# Patient Record
Sex: Female | Born: 1995 | Race: White | Hispanic: No | Marital: Single | Smoking: Never smoker
Health system: Southern US, Community
[De-identification: ages and names within clinical notes are randomized; demographics above are authoritative.]

## PROBLEM LIST (undated history)

## (undated) DIAGNOSIS — D649 Anemia, unspecified: Secondary | ICD-10-CM

## (undated) HISTORY — DX: Anemia, unspecified: D64.9

## (undated) HISTORY — PX: NO PAST SURGERIES: SHX2092

---

## 2017-10-27 ENCOUNTER — Telehealth: Payer: Self-pay | Admitting: *Deleted

## 2017-10-27 DIAGNOSIS — O3680X0 Pregnancy with inconclusive fetal viability, not applicable or unspecified: Secondary | ICD-10-CM

## 2017-10-27 NOTE — Telephone Encounter (Signed)
Pt left message on 8/16 stating that she is calling to speak with Jeanetta to get an ultrasound scheduled.  [facility] was [facility]. Per discussion w/Jeanetta Louellen MolderBellamy, RN - she received call from the [facility] on 8/14 regarding need for US and has been trying to contact pt to obtain demographics in order to schedule the requested study. I spoke w/pt and obtained the necessary information. US scheduled on 8/22 @ 1545. Pt advised to have a full bladder and to bring insurance card to the appt.  She will receive results in our office following the ultrasound. Pt voiced understanding of all information and instructions given.

## 2017-10-30 ENCOUNTER — Ambulatory Visit (HOSPITAL_COMMUNITY)
Admission: RE | Admit: 2017-10-30 | Discharge: 2017-10-30 | Disposition: A | Discharge status: Home / Self Care | Payer: BC Managed Care – PPO | Source: Ambulatory Visit | Attending: Family Medicine | Admitting: Family Medicine

## 2017-10-30 DIAGNOSIS — O3680X0 Pregnancy with inconclusive fetal viability, not applicable or unspecified: Secondary | ICD-10-CM | POA: Diagnosis present

## 2017-10-30 DIAGNOSIS — Z3A09 9 weeks gestation of pregnancy: Secondary | ICD-10-CM | POA: Insufficient documentation

## 2017-12-01 ENCOUNTER — Other Ambulatory Visit (INDEPENDENT_AMBULATORY_CARE_PROVIDER_SITE_OTHER): Payer: BC Managed Care – PPO

## 2017-12-01 ENCOUNTER — Encounter: Payer: Self-pay | Admitting: Certified Nurse Midwife

## 2017-12-01 ENCOUNTER — Ambulatory Visit (INDEPENDENT_AMBULATORY_CARE_PROVIDER_SITE_OTHER): Payer: BC Managed Care – PPO | Admitting: Certified Nurse Midwife

## 2017-12-01 VITALS — BP 133/88 | HR 118 | Ht 66.0 in | Wt 204.7 lb

## 2017-12-01 DIAGNOSIS — Z3402 Encounter for supervision of normal first pregnancy, second trimester: Secondary | ICD-10-CM

## 2017-12-01 DIAGNOSIS — N926 Irregular menstruation, unspecified: Secondary | ICD-10-CM

## 2017-12-01 DIAGNOSIS — Z3A14 14 weeks gestation of pregnancy: Secondary | ICD-10-CM | POA: Diagnosis not present

## 2017-12-01 DIAGNOSIS — N8311 Corpus luteum cyst of right ovary: Secondary | ICD-10-CM

## 2017-12-01 DIAGNOSIS — Z6833 Body mass index (BMI) 33.0-33.9, adult: Secondary | ICD-10-CM

## 2017-12-01 DIAGNOSIS — O3412 Maternal care for benign tumor of corpus uteri, second trimester: Secondary | ICD-10-CM

## 2017-12-01 DIAGNOSIS — O0932 Supervision of pregnancy with insufficient antenatal care, second trimester: Secondary | ICD-10-CM | POA: Diagnosis not present

## 2017-12-01 NOTE — Progress Notes (Signed)
NEW OB HISTORY AND PHYSICAL  SUBJECTIVE:       Crystal Shepard is a 22 y.o. G1P0 female, Patient's last menstrual period was 08/23/2017., Estimated Date of Delivery: 05/30/18, [redacted]w[redacted]d, presents today for establishment of Prenatal Care.  She has no unusual complaints. Endorses nausea without vomiting, breast tenderness, and intermittent mild abdominal cramping.   Denies difficulty breathing or respiratory distress, chest pain, abdominal pain, dysuria, vaginal bleeding, and leg pain or swelling.   Requests postpartum pap smear.    Gynecologic History  Patient's last menstrual period was 08/23/2017.   Contraception: none  Last Pap: 2014. Results were: normal  Obstetric History OB History  Gravida Para Term Preterm AB Living  1            SAB TAB Ectopic Multiple Live Births               # Outcome Date GA Lbr Len/2nd Weight Sex Delivery Anes PTL Lv  1 Current             History reviewed. No pertinent past medical history.  History reviewed. No pertinent surgical history.  Current Outpatient Medications on File Prior to Visit  Medication Sig Dispense Refill  . Prenatal Vit-Fe Fumarate-FA (PRENATAL MULTIVITAMIN) TABS tablet Take 1 tablet by mouth daily at 12 noon.     No current facility-administered medications on file prior to visit.     No Known Allergies  Social History   Socioeconomic History  . Marital status: Single    Spouse name: Not on file  . Number of children: Not on file  . Years of education: Not on file  . Highest education level: Not on file  Occupational History  . Not on file  Social Needs  . Financial resource strain: Not on file  . Food insecurity:    Worry: Not on file    Inability: Not on file  . Transportation needs:    Medical: Not on file    Non-medical: Not on file  Tobacco Use  . Smoking status: Never Smoker  . Smokeless tobacco: Never Used  Substance and Sexual Activity  . Alcohol use: Not Currently  . Drug use: Never  . Sexual  activity: Yes  Lifestyle  . Physical activity:    Days per week: Not on file    Minutes per session: Not on file  . Stress: Not on file  Relationships  . Social connections:    Talks on phone: Not on file    Gets together: Not on file    Attends religious service: Not on file    Active member of club or organization: Not on file    Attends meetings of clubs or organizations: Not on file    Relationship status: Not on file  . Intimate partner violence:    Fear of current or ex partner: Not on file    Emotionally abused: Not on file    Physically abused: Not on file    Forced sexual activity: Not on file  Other Topics Concern  . Not on file  Social History Narrative  . Not on file    History reviewed. No pertinent family history.  The following portions of the patient's history were reviewed and updated as appropriate: allergies, current medications, past OB history, past medical history, past surgical history, past family history, past social history, and problem list.    OBJECTIVE:  BP 133/88   Pulse (!) 118   Ht 5\' 6"  (1.676 m)   Wt  204 lb 11.2 oz (92.9 kg)   LMP 08/23/2017   BMI 33.04 kg/m   Initial Physical Exam (New OB)  GENERAL APPEARANCE: alert, well appearing, in no apparent distress  HEAD: normocephalic, atraumatic  MOUTH: mucous membranes moist, pharynx normal without lesions  THYROID: no thyromegaly or masses present  BREASTS: patient declined exam  LUNGS: clear to auscultation, no wheezes, rales or rhonchi, symmetric air entry  HEART: regular rate and rhythm, no murmurs  ABDOMEN: soft, nontender, nondistended, no abnormal masses, no epigastric pain and FHT present; obese  EXTREMITIES: no redness or tenderness in the calves or thighs, no edema  SKIN: normal coloration and turgor, no rashes  LYMPH NODES: no adenopathy palpable  NEUROLOGIC: alert, oriented, normal speech, no focal findings or movement disorder noted  PELVIC EXAM: not  indicated  ULTRASOUND REPORT  Location: [facility] Date of Service:  12/01/2017  Indications: Dating/Viability Findings:  Mason JimSingleton intrauterine pregnancy is visualized with a CRL consistent with [redacted] weeks gestation, giving an (U/S) EDD of 06/01/18. The (U/S) EDD is consistent with the clinically established (LMP) EDD of 05/30/18.  FHR: 145 BPM CRL measurement: 80.5 mm Early anatomy is normal. Placenta appears anterior and is grade 0.  Right Ovary measures 3.7 x 2.8 x 2.3 cm. It is normal in appearance. Left Ovary was not visualized. There is evidence of a corpus luteal cyst in the right ovary. Survey of the adnexa demonstrates no adnexal masses. There is no free peritoneal fluid in the cul de sac.  Impression: 1. 14 week Viable Singleton Intrauterine pregnancy by U/S. 2. (U/S) EDD is consistent with Clinically established (LMP) EDD of 05/30/18.  Recommendations: 1.Clinical correlation with the patient's History and Physical Exam.  ASSESSMENT: Normal pregnancy Late entry prenatal care  PLAN: Prenatal care New OB counseling: The patient has been given an overview regarding routine prenatal care. Recommendations regarding diet, weight gain, and exercise in pregnancy were given. Prenatal testing, optional genetic testing, and ultrasound use in pregnancy were reviewed.  Benefits of Breast Feeding were discussed. The patient is encouraged to consider nursing her baby post partum. See orders   Gunnar BullaJenkins Michelle Deniah Saia, CNM [facility], [facility]

## 2017-12-01 NOTE — Patient Instructions (Addendum)
Round Ligament Pain The round ligament is a cord of muscle and tissue that helps to support the uterus. It can become a source of pain during pregnancy if it becomes stretched or twisted as the baby grows. The pain usually begins in the second trimester of pregnancy, and it can come and go until the baby is delivered. It is not a serious problem, and it does not cause harm to the baby. Round ligament pain is usually a short, sharp, and pinching pain, but it can also be a dull, lingering, and aching pain. The pain is felt in the lower side of the abdomen or in the groin. It usually starts deep in the groin and moves up to the outside of the hip area. Pain can occur with:  A sudden change in position.  Rolling over in bed.  Coughing or sneezing.  Physical activity.  Follow these instructions at home: Watch your condition for any changes. Take these steps to help with your pain:  When the pain starts, relax. Then try: ? Sitting down. ? Flexing your knees up to your abdomen. ? Lying on your side with one pillow under your abdomen and another pillow between your legs. ? Sitting in a warm bath for 15-20 minutes or until the pain goes away.  Take over-the-counter and prescription medicines only as told by your health care provider.  Move slowly when you sit and stand.  Avoid long walks if they cause pain.  Stop or lessen your physical activities if they cause pain.  Contact a health care provider if:  Your pain does not go away with treatment.  You feel pain in your back that you did not have before.  Your medicine is not helping. Get help right away if:  You develop a fever or chills.  You develop uterine contractions.  You develop vaginal bleeding.  You develop nausea or vomiting.  You develop diarrhea.  You have pain when you urinate. This information is not intended to replace advice given to you by your health care provider. Make sure you discuss any questions you have  with your health care provider. Document Released: 12/05/2007 Document Revised: 08/03/2015 Document Reviewed: 05/04/2014 Elsevier Interactive Patient Education  2018 [employer]. Back Pain in Pregnancy Back pain during pregnancy is common. Back pain may be caused by several factors that are related to changes during your pregnancy. Follow these instructions at home: Managing pain, stiffness, and swelling  If directed, apply ice for sudden (acute) back pain. ? Put ice in a plastic bag. ? Place a towel between your skin and the bag. ? Leave the ice on for 20 minutes, 2-3 times per day.  If directed, apply heat to the affected area before you exercise: ? Place a towel between your skin and the heat pack or heating pad. ? Leave the heat on for 20-30 minutes. ? Remove the heat if your skin turns bright red. This is especially important if you are unable to feel pain, heat, or cold. You may have a greater risk of getting burned. Activity  Exercise as told by your health care provider. Exercising is the best way to prevent or manage back pain.  Listen to your body when lifting. If lifting hurts, ask for help or bend your knees. This uses your leg muscles instead of your back muscles.  Squat down when picking up something from the floor. Do not bend over.  Only use bed rest as told by your health care provider. Bed  rest should only be used for the most severe episodes of back pain. Standing, Sitting, and Lying Down  Do not stand in one place for long periods of time.  Use good posture when sitting. Make sure your head rests over your shoulders and is not hanging forward. Use a pillow on your lower back if necessary.  Try sleeping on your side, preferably the left side, with a pillow or two between your legs. If you are sore after a night's rest, your bed may be too soft. A firm mattress may provide more support for your back during pregnancy. General instructions  Do not wear high  heels.  Eat a healthy diet. Try to gain weight within your health care provider's recommendations.  Use a maternity girdle, elastic sling, or back brace as told by your health care provider.  Take over-the-counter and prescription medicines only as told by your health care provider.  Keep all follow-up visits as told by your health care provider. This is important. This includes any visits with any specialists, such as a physical therapist. Contact a health care provider if:  Your back pain interferes with your daily activities.  You have increasing pain in other parts of your body. Get help right away if:  You develop numbness, tingling, weakness, or problems with the use of your arms or legs.  You develop severe back pain that is not controlled with medicine.  You have a sudden change in bowel or bladder control.  You develop shortness of breath, dizziness, or you faint.  You develop nausea, vomiting, or sweating.  You have back pain that is a rhythmic, cramping pain similar to labor pains. Labor pain is usually 1-2 minutes apart, lasts for about 1 minute, and involves a bearing down feeling or pressure in your pelvis.  You have back pain and your water breaks or you have vaginal bleeding.  You have back pain or numbness that travels down your leg.  Your back pain developed after you fell.  You develop pain on one side of your back.  You see blood in your urine.  You develop skin blisters in the area of your back pain. This information is not intended to replace advice given to you by your health care provider. Make sure you discuss any questions you have with your health care provider. Document Released: 06/05/2005 Document Revised: 08/03/2015 Document Reviewed: 11/09/2014 Elsevier Interactive Patient Education  2018 [place] for Pregnant Women While you are pregnant, your body will require additional nutrition to help support your growing baby. It is  recommended that you consume:  150 additional calories each day during your first trimester.  300 additional calories each day during your second trimester.  300 additional calories each day during your third trimester.  Eating a healthy, well-balanced diet is very important for your health and for your baby's health. You also have a higher need for some vitamins and minerals, such as folic acid, calcium, iron, and vitamin D. What do I need to know about eating during pregnancy?  Do not try to lose weight or go on a diet during pregnancy.  Choose healthy, nutritious foods. Choose  of a sandwich with a glass of milk instead of a candy bar or a high-calorie sugar-sweetened beverage.  Limit your overall intake of foods that have "empty calories." These are foods that have little nutritional value, such as sweets, desserts, candies, sugar-sweetened beverages, and fried foods.  Eat a variety of foods, especially fruits and  vegetables.  Take a prenatal vitamin to help meet the additional needs during pregnancy, specifically for folic acid, iron, calcium, and vitamin D.  Remember to stay active. Ask your health care provider for exercise recommendations that are specific to you.  Practice good food safety and cleanliness, such as washing your hands before you eat and after you prepare raw meat. This helps to prevent foodborne illnesses, such as listeriosis, that can be very dangerous for your baby. Ask your health care provider for more information about listeriosis. What does 150 extra calories look like? Healthy options for an additional 150 calories each day could be any of the following:  Plain low-fat yogurt (6-8 oz) with  cup of berries.  1 apple with 2 teaspoons of peanut butter.  Cut-up vegetables with  cup of hummus.  Low-fat chocolate milk (8 oz or 1 cup).  1 string cheese with 1 medium orange.   of a peanut butter and jelly sandwich on whole-wheat bread (1 tsp of peanut  butter).  For 300 calories, you could eat two of those healthy options each day. What is a healthy amount of weight to gain? The recommended amount of weight for you to gain is based on your pre-pregnancy BMI. If your pre-pregnancy BMI was:  Less than 18 (underweight), you should gain 28-40 lb.  18-24.9 (normal), you should gain 25-35 lb.  25-29.9 (overweight), you should gain 15-25 lb.  Greater than 30 (obese), you should gain 11-20 lb.  What if I am having twins or multiples? Generally, pregnant women who will be having twins or multiples may need to increase their daily calories by 300-600 calories each day. The recommended range for total weight gain is 25-54 lb, depending on your pre-pregnancy BMI. Talk with your health care provider for specific guidance about additional nutritional needs, weight gain, and exercise during your pregnancy. What foods can I eat? Grains Any grains. Try to choose whole grains, such as whole-wheat bread, oatmeal, or brown rice. Vegetables Any vegetables. Try to eat a variety of colors and types of vegetables to get a full range of vitamins and minerals. Remember to wash your vegetables well before eating. Fruits Any fruits. Try to eat a variety of colors and types of fruit to get a full range of vitamins and minerals. Remember to wash your fruits well before eating. Meats and Other Protein Sources Lean meats, including chicken, [place], fish, and lean cuts of beef, veal, or pork. Make sure that all meats are cooked to "well done." Tofu. Tempeh. Beans. Eggs. Peanut butter and other nut butters. Seafood, such as shrimp, crab, and lobster. If you choose fish, select types that are higher in omega-3 fatty acids, including salmon, herring, mussels, trout, sardines, and pollock. Make sure that all meats are cooked to food-safe temperatures. Dairy Pasteurized milk and milk alternatives. Pasteurized yogurt and pasteurized cheese. Cottage cheese. Sour  cream. Beverages Water. Juices that contain 100% fruit juice or vegetable juice. Caffeine-free teas and decaffeinated coffee. Drinks that contain caffeine are okay to drink, but it is better to avoid caffeine. Keep your total caffeine intake to less than 200 mg each day (12 oz of coffee, tea, or soda) or as directed by your health care provider. Condiments Any pasteurized condiments. Sweets and Desserts Any sweets and desserts. Fats and Oils Any fats and oils. The items listed above may not be a complete list of recommended foods or beverages. Contact your dietitian for more options. What foods are not recommended? Vegetables Unpasteurized (raw)  vegetable juices. Fruits Unpasteurized (raw) fruit juices. Meats and Other Protein Sources Cured meats that have nitrates, such as bacon, salami, and hotdogs. Luncheon meats, bologna, or other deli meats (unless they are reheated until they are steaming hot). Refrigerated pate, meat spreads from a meat counter, smoked seafood that is found in the refrigerated section of a store. Raw fish, such as sushi or sashimi. High mercury content fish, such as tilefish, shark, swordfish, and king mackerel. Raw meats, such as tuna or beef tartare. Undercooked meats and poultry. Make sure that all meats are cooked to food-safe temperatures. Dairy Unpasteurized (raw) milk and any foods that have raw milk in them. Soft cheeses, such as feta, queso blanco, queso fresco, Brie, Camembert cheeses, blue-veined cheeses, and Panela cheese (unless it is made with pasteurized milk, which must be stated on the label). Beverages Alcohol. Sugar-sweetened beverages, such as sodas, teas, or energy drinks. Condiments Homemade fermented foods and drinks, such as pickles, sauerkraut, or kombucha drinks. (Store-bought pasteurized versions of these are okay.) Other Salads that are made in the store, such as ham salad, chicken salad, egg salad, tuna salad, and seafood salad. The items  listed above may not be a complete list of foods and beverages to avoid. Contact your dietitian for more information. This information is not intended to replace advice given to you by your health care provider. Make sure you discuss any questions you have with your health care provider. Document Released: 12/10/2013 Document Revised: 08/03/2015 Document Reviewed: 08/10/2013 Elsevier Interactive Patient Education  2018 [employer]. Common Medications Safe in Pregnancy  Acne:      Constipation:  Benzoyl Peroxide     Colace  Clindamycin      Dulcolax Suppository  Topica Erythromycin     Fibercon  Salicylic Acid      Metamucil         Miralax AVOID:        Senakot   Accutane    Cough:  Retin-A       Cough Drops  Tetracycline      Phenergan w/ Codeine if Rx  Minocycline      Robitussin (Plain & DM)  Antibiotics:     Crabs/Lice:  Ceclor       RID  Cephalosporins    AVOID:  E-Mycins      Kwell  Keflex  Macrobid/Macrodantin   Diarrhea:  Penicillin      Kao-Pectate  Zithromax      Imodium AD         PUSH FLUIDS AVOID:       Cipro     Fever:  Tetracycline      Tylenol (Regular or Extra  Minocycline       Strength)  Levaquin      Extra Strength-Do not          Exceed 8 tabs/24 hrs Caffeine:        <245m/day (equiv. To 1 cup of coffee or  approx. 3 12 oz sodas)         Gas: Cold/Hayfever:       Gas-X  Benadryl      Mylicon  Claritin       Phazyme  **Claritin-D        Chlor-Trimeton    Headaches:  Dimetapp      ASA-Free Excedrin  Drixoral-Non-Drowsy     Cold Compress  Mucinex (Guaifenasin)     Tylenol (Regular or Extra  Sudafed/Sudafed-12 Hour     Strength)  **Sudafed PE Pseudoephedrine  Tylenol Cold & Sinus     Vicks Vapor Rub  Zyrtec  **AVOID if Problems With Blood Pressure         Heartburn: Avoid lying down for at least 1 hour after meals  Aciphex      Maalox     Rash:  Milk of Magnesia     Benadryl    Mylanta       1% Hydrocortisone Cream  Pepcid  Pepcid  Complete   Sleep Aids:  Prevacid      Ambien   Prilosec       Benadryl  Rolaids       Chamomile Tea  Tums (Limit 4/day)     Unisom  Zantac       Tylenol PM         Warm milk-add vanilla or  Hemorrhoids:       Sugar for taste  Anusol/Anusol H.C.  (RX: Analapram 2.5%)  Sugar Substitutes:  Hydrocortisone OTC     Ok in moderation  Preparation H      Tucks        Vaseline lotion applied to tissue with wiping    Herpes:     Throat:  Acyclovir      Oragel  Famvir  Valtrex     Vaccines:         Flu Shot Leg Cramps:       *Gardasil  Benadryl      Hepatitis A         Hepatitis B Nasal Spray:       Pneumovax  Saline Nasal Spray     Polio Booster         Tetanus Nausea:       Tuberculosis test or PPD  Vitamin B6 25 mg TID   AVOID:    Dramamine      *Gardasil  Emetrol       Live Poliovirus  Ginger Root 250 mg QID    MMR (measles, mumps &  High Complex Carbs @ Bedtime    rebella)  Sea Bands-Accupressure    Varicella (Chickenpox)  Unisom 1/2 tab TID     *No known complications           If received before Pain:         Known pregnancy;   Darvocet       Resume series after  Lortab        Delivery  Percocet    Yeast:   Tramadol      Femstat  Tylenol 3      Gyne-lotrimin  Ultram       Monistat  Vicodin           MISC:         All Sunscreens           Hair Coloring/highlights          Insect Repellant's          (Including DEET)         Mystic Tans Second Trimester of Pregnancy The second trimester is from week 13 through week 28, month 4 through 6. This is often the time in pregnancy that you feel your best. Often times, morning sickness has lessened or quit. You may have more energy, and you may get hungry more often. Your unborn baby (fetus) is growing rapidly. At the end of the sixth month, he or she is about 9 inches long and weighs about 1 pounds. You will likely feel the  baby move (quickening) between 18 and 20 weeks of pregnancy. Follow these instructions at home:  Avoid  all smoking, herbs, and alcohol. Avoid drugs not approved by your doctor.  Do not use any tobacco products, including cigarettes, chewing tobacco, and electronic cigarettes. If you need help quitting, ask your doctor. You may get counseling or other support to help you quit.  Only take medicine as told by your doctor. Some medicines are safe and some are not during pregnancy.  Exercise only as told by your doctor. Stop exercising if you start having cramps.  Eat regular, healthy meals.  Wear a good support bra if your breasts are tender.  Do not use hot tubs, steam rooms, or saunas.  Wear your seat belt when driving.  Avoid raw meat, uncooked cheese, and liter boxes and soil used by cats.  Take your prenatal vitamins.  Take 1500-2000 milligrams of calcium daily starting at the 20th week of pregnancy until you deliver your baby.  Try taking medicine that helps you poop (stool softener) as needed, and if your doctor approves. Eat more fiber by eating fresh fruit, vegetables, and whole grains. Drink enough fluids to keep your pee (urine) clear or pale yellow.  Take warm water baths (sitz baths) to soothe pain or discomfort caused by hemorrhoids. Use hemorrhoid cream if your doctor approves.  If you have puffy, bulging veins (varicose veins), wear support hose. Raise (elevate) your feet for 15 minutes, 3-4 times a day. Limit salt in your diet.  Avoid heavy lifting, wear low heals, and sit up straight.  Rest with your legs raised if you have leg cramps or low back pain.  Visit your dentist if you have not gone during your pregnancy. Use a soft toothbrush to brush your teeth. Be gentle when you floss.  You can have sex (intercourse) unless your doctor tells you not to.  Go to your doctor visits. Get help if:  You feel dizzy.  You have mild cramps or pressure in your lower belly (abdomen).  You have a nagging pain in your belly area.  You continue to feel sick to your stomach  (nauseous), throw up (vomit), or have watery poop (diarrhea).  You have bad smelling fluid coming from your vagina.  You have pain with peeing (urination). Get help right away if:  You have a fever.  You are leaking fluid from your vagina.  You have spotting or bleeding from your vagina.  You have severe belly cramping or pain.  You lose or gain weight rapidly.  You have trouble catching your breath and have chest pain.  You notice sudden or extreme puffiness (swelling) of your face, hands, ankles, feet, or legs.  You have not felt the baby move in over an hour.  You have severe headaches that do not go away with medicine.  You have vision changes. This information is not intended to replace advice given to you by your health care provider. Make sure you discuss any questions you have with your health care provider. Document Released: 05/22/2009 Document Revised: 08/03/2015 Document Reviewed: 04/28/2012 Elsevier Interactive Patient Education  2017 [employer] WHAT OB PATIENTS CAN EXPECT   Confirmation of pregnancy and ultrasound ordered if medically indicated-[redacted] weeks gestation  New OB (NOB) intake with nurse and New OB (NOB) labs- [redacted] weeks gestation  New OB (NOB) physical examination with provider- 11/[redacted] weeks gestation  Flu vaccine-[redacted] weeks gestation  Anatomy scan-[redacted] weeks gestation  Glucose tolerance test, blood work to test for  anemia, T-dap vaccine-[redacted] weeks gestation  Vaginal swabs/cultures-STD/Group B strep-[redacted] weeks gestation  Appointments every 4 weeks until 28 weeks  Every 2 weeks from 28 weeks until 36 weeks  Weekly visits from 36 weeks until delivery

## 2017-12-01 NOTE — Progress Notes (Signed)
Crystal Shepard presents for NOB nurse interview visit. Pregnancy confirmation done _here at [facility].  G- 1.  P-    . Pregnancy education material explained and given. There is  cats in the home. NOB labs ordered. (TSH/HbgA1c due to Increased BMI),. HIV labs and Drug screen were explained optional and she did not decline. Drug screen ordered PNV encouraged. Genetic screening options discussed. Genetic testing: Ordered/.  Pt may discuss with provider.   All questions answered. Pt signed FMLA form  Pt came in for confirmation, LMP 08/23/17, EDD 05/30/18, will go ahead and give pt NOB packet

## 2017-12-02 LAB — ABO AND RH: RH TYPE: POSITIVE

## 2017-12-02 LAB — URINALYSIS, ROUTINE W REFLEX MICROSCOPIC
Bilirubin, UA: NEGATIVE
Glucose, UA: NEGATIVE
Ketones, UA: NEGATIVE
LEUKOCYTES UA: NEGATIVE
NITRITE UA: NEGATIVE
Protein, UA: NEGATIVE
RBC, UA: NEGATIVE
Specific Gravity, UA: 1.019 (ref 1.005–1.030)
Urobilinogen, Ur: 0.2 mg/dL (ref 0.2–1.0)
pH, UA: 7.5 (ref 5.0–7.5)

## 2017-12-02 LAB — RPR: RPR Ser Ql: NONREACTIVE

## 2017-12-02 LAB — TSH: TSH: 1.72 u[IU]/mL (ref 0.450–4.500)

## 2017-12-02 LAB — RUBELLA SCREEN: RUBELLA: 5.56 {index} (ref >0.99)

## 2017-12-02 LAB — HEMOGLOBIN A1C
ESTIMATED AVERAGE GLUCOSE: 111 mg/dL
HEMOGLOBIN A1C: 5.5 % (ref 4.8–5.6)

## 2017-12-02 LAB — VARICELLA ZOSTER ANTIBODY, IGG: VARICELLA: 259 {index} (ref >165)

## 2017-12-02 LAB — HEPATITIS B SURFACE ANTIGEN: Hepatitis B Surface Ag: NEGATIVE

## 2017-12-02 LAB — HIV ANTIBODY (ROUTINE TESTING W REFLEX): HIV Screen 4th Generation wRfx: NONREACTIVE

## 2017-12-02 LAB — ANTIBODY SCREEN: ANTIBODY SCREEN: NEGATIVE

## 2017-12-03 LAB — URINE CULTURE

## 2017-12-03 LAB — GC/CHLAMYDIA PROBE AMP
Chlamydia trachomatis, NAA: NEGATIVE
NEISSERIA GONORRHOEAE BY PCR: NEGATIVE

## 2017-12-04 DIAGNOSIS — Z6833 Body mass index (BMI) 33.0-33.9, adult: Secondary | ICD-10-CM | POA: Insufficient documentation

## 2017-12-11 ENCOUNTER — Telehealth: Payer: Self-pay

## 2017-12-11 NOTE — Telephone Encounter (Signed)
Message left for pt- Panorama results are here. Requested she check her mychart messages.

## 2017-12-15 ENCOUNTER — Encounter: Payer: Self-pay | Admitting: Certified Nurse Midwife

## 2017-12-15 ENCOUNTER — Ambulatory Visit (INDEPENDENT_AMBULATORY_CARE_PROVIDER_SITE_OTHER): Payer: BC Managed Care – PPO | Admitting: Certified Nurse Midwife

## 2017-12-15 VITALS — BP 128/82 | HR 92 | Wt 206.2 lb

## 2017-12-15 DIAGNOSIS — Z23 Encounter for immunization: Secondary | ICD-10-CM

## 2017-12-15 DIAGNOSIS — Z3402 Encounter for supervision of normal first pregnancy, second trimester: Secondary | ICD-10-CM

## 2017-12-15 LAB — POCT URINALYSIS DIPSTICK OB
Bilirubin, UA: NEGATIVE
GLUCOSE, UA: NEGATIVE
Ketones, UA: NEGATIVE
LEUKOCYTES UA: NEGATIVE
NITRITE UA: NEGATIVE
PH UA: 6.5 (ref 5.0–8.0)
PROTEIN: NEGATIVE
RBC UA: NEGATIVE
SPEC GRAV UA: 1.01 (ref 1.010–1.025)
Urobilinogen, UA: 0.2 E.U./dL

## 2017-12-15 NOTE — Patient Instructions (Signed)

## 2017-12-15 NOTE — Progress Notes (Signed)
ROB , doing well. Discussed early glucose screen @ 18 wks. Reviewed weight gain goals of 11-20lbs, discussed round ligament pain and use of belly band. She will return in 2 wks for lab, 4 wks for anatomy scan and ROB.   Doreene Burke, CNM

## 2017-12-15 NOTE — Progress Notes (Signed)
ROB- pt is doing well, flu vaccine given 

## 2017-12-29 ENCOUNTER — Other Ambulatory Visit: Payer: BC Managed Care – PPO

## 2017-12-29 DIAGNOSIS — Z3402 Encounter for supervision of normal first pregnancy, second trimester: Secondary | ICD-10-CM

## 2017-12-30 LAB — GLUCOSE TOLERANCE, 1 HOUR: GLUCOSE, 1HR PP: 130 mg/dL (ref 65–199)

## 2018-01-14 ENCOUNTER — Ambulatory Visit (INDEPENDENT_AMBULATORY_CARE_PROVIDER_SITE_OTHER): Payer: BC Managed Care – PPO | Admitting: Obstetrics and Gynecology

## 2018-01-14 ENCOUNTER — Ambulatory Visit (INDEPENDENT_AMBULATORY_CARE_PROVIDER_SITE_OTHER): Payer: BC Managed Care – PPO

## 2018-01-14 VITALS — BP 129/76 | HR 86 | Wt 209.3 lb

## 2018-01-14 DIAGNOSIS — O09899 Supervision of other high risk pregnancies, unspecified trimester: Secondary | ICD-10-CM

## 2018-01-14 DIAGNOSIS — Z3402 Encounter for supervision of normal first pregnancy, second trimester: Secondary | ICD-10-CM | POA: Diagnosis not present

## 2018-01-14 DIAGNOSIS — Z3492 Encounter for supervision of normal pregnancy, unspecified, second trimester: Secondary | ICD-10-CM | POA: Diagnosis not present

## 2018-01-14 LAB — POCT URINALYSIS DIPSTICK OB
BILIRUBIN UA: NEGATIVE
Glucose, UA: NEGATIVE
Ketones, UA: NEGATIVE
Leukocytes, UA: NEGATIVE
Nitrite, UA: NEGATIVE
PH UA: 6 (ref 5.0–8.0)
POC,PROTEIN,UA: NEGATIVE
RBC UA: NEGATIVE
Spec Grav, UA: 1.01 (ref 1.010–1.025)
UROBILINOGEN UA: 0.2 U/dL

## 2018-01-14 NOTE — Progress Notes (Signed)
ROB and anatomy scan-  Reviewed anatomy scan findings:   Indications: Anatomy Findings:  Singleton intrauterine pregnancy is visualized with FHR at 144 BPM. Biometrics give an (U/S) Gestational age of [redacted] weeks and an (U/S) EDD of 05/27/18; this correlates with the clinically established EDD of 05/29/18.  Fetal presentation is breech.  EFW: 387 grams (0lb 14oz). Placenta: Posterior and grade 1. AFI: WNL subjectively.  2 vessel umbilical cord.  Anatomic survey is complete and appears WNL; Gender - Female.   Right Ovary measures 2.9 x 2.6 x 2.0 cm. It is normal in appearance. Left Ovary measures 2.3 x 2.1 x 2.1 cm. It is normal appearance. There is no obvious evidence of a corpus luteal cyst. Survey of the adnexa demonstrates no adnexal masses. There is no free peritoneal fluid in the cul de sac.  Impression: 1. 21 week Viable Singleton Intrauterine pregnancy by U/S. 2. (U/S) EDD is consistent with Clinically established (LMP) EDD of 05/30/18. 3. Normal Anatomy Scan 4. 2 vessel cord

## 2018-02-13 ENCOUNTER — Ambulatory Visit (INDEPENDENT_AMBULATORY_CARE_PROVIDER_SITE_OTHER): Payer: BC Managed Care – PPO | Admitting: Certified Nurse Midwife

## 2018-02-13 VITALS — BP 119/83 | HR 103 | Wt 216.9 lb

## 2018-02-13 DIAGNOSIS — Z113 Encounter for screening for infections with a predominantly sexual mode of transmission: Secondary | ICD-10-CM

## 2018-02-13 DIAGNOSIS — Z3492 Encounter for supervision of normal pregnancy, unspecified, second trimester: Secondary | ICD-10-CM

## 2018-02-13 DIAGNOSIS — Z130 Encounter for screening for diseases of the blood and blood-forming organs and certain disorders involving the immune mechanism: Secondary | ICD-10-CM

## 2018-02-13 DIAGNOSIS — Z131 Encounter for screening for diabetes mellitus: Secondary | ICD-10-CM

## 2018-02-13 DIAGNOSIS — O09899 Supervision of other high risk pregnancies, unspecified trimester: Secondary | ICD-10-CM | POA: Insufficient documentation

## 2018-02-13 DIAGNOSIS — O09892 Supervision of other high risk pregnancies, second trimester: Secondary | ICD-10-CM

## 2018-02-13 LAB — POCT URINALYSIS DIPSTICK OB
BILIRUBIN UA: NEGATIVE
GLUCOSE, UA: NEGATIVE
KETONES UA: NEGATIVE
Nitrite, UA: NEGATIVE
PH UA: 7.5 (ref 5.0–8.0)
POC,PROTEIN,UA: NEGATIVE
RBC UA: NEGATIVE
SPEC GRAV UA: 1.01 (ref 1.010–1.025)
UROBILINOGEN UA: 0.2 U/dL

## 2018-02-13 MED ORDER — FLUOXETINE HCL 10 MG PO CAPS: 10.0000 mg | ORAL_CAPSULE | Freq: Every day | ORAL | 1 refills | Status: DC

## 2018-02-13 NOTE — Progress Notes (Signed)
ROB, c/o feeling "emotional" since pregnancy started, crying spells every other day.

## 2018-02-13 NOTE — Patient Instructions (Signed)
Eating Plan for Pregnant Women While you are pregnant, your body will require additional nutrition to help support your growing baby. It is recommended that you consume:  150 additional calories each day during your first trimester.  300 additional calories each day during your second trimester.  300 additional calories each day during your third trimester.  Eating a healthy, well-balanced diet is very important for your health and for your baby's health. You also have a higher need for some vitamins and minerals, such as folic acid, calcium, iron, and vitamin D. What do I need to know about eating during pregnancy?  Do not try to lose weight or go on a diet during pregnancy.  Choose healthy, nutritious foods. Choose  of a sandwich with a glass of milk instead of a candy bar or a high-calorie sugar-sweetened beverage.  Limit your overall intake of foods that have "empty calories." These are foods that have little nutritional value, such as sweets, desserts, candies, sugar-sweetened beverages, and fried foods.  Eat a variety of foods, especially fruits and vegetables.  Take a prenatal vitamin to help meet the additional needs during pregnancy, specifically for folic acid, iron, calcium, and vitamin D.  Remember to stay active. Ask your health care provider for exercise recommendations that are specific to you.  Practice good food safety and cleanliness, such as washing your hands before you eat and after you prepare raw meat. This helps to prevent foodborne illnesses, such as listeriosis, that can be very dangerous for your baby. Ask your health care provider for more information about listeriosis. What does 150 extra calories look like? Healthy options for an additional 150 calories each day could be any of the following:  Plain low-fat yogurt (6-8 oz) with  cup of berries.  1 apple with 2 teaspoons of peanut butter.  Cut-up vegetables with  cup of hummus.  Low-fat chocolate milk  (8 oz or 1 cup).  1 string cheese with 1 medium orange.   of a peanut butter and jelly sandwich on whole-wheat bread (1 tsp of peanut butter).  For 300 calories, you could eat two of those healthy options each day. What is a healthy amount of weight to gain? The recommended amount of weight for you to gain is based on your pre-pregnancy BMI. If your pre-pregnancy BMI was:  Less than 18 (underweight), you should gain 28-40 lb.  18-24.9 (normal), you should gain 25-35 lb.  25-29.9 (overweight), you should gain 15-25 lb.  Greater than 30 (obese), you should gain 11-20 lb.  What if I am having twins or multiples? Generally, pregnant women who will be having twins or multiples may need to increase their daily calories by 300-600 calories each day. The recommended range for total weight gain is 25-54 lb, depending on your pre-pregnancy BMI. Talk with your health care provider for specific guidance about additional nutritional needs, weight gain, and exercise during your pregnancy. What foods can I eat? Grains Any grains. Try to choose whole grains, such as whole-wheat bread, oatmeal, or brown rice. Vegetables Any vegetables. Try to eat a variety of colors and types of vegetables to get a full range of vitamins and minerals. Remember to wash your vegetables well before eating. Fruits Any fruits. Try to eat a variety of colors and types of fruit to get a full range of vitamins and minerals. Remember to wash your fruits well before eating. Meats and Other Protein Sources Lean meats, including chicken, [place], fish, and lean cuts of beef, veal,  or pork. Make sure that all meats are cooked to "well done." Tofu. Tempeh. Beans. Eggs. Peanut butter and other nut butters. Seafood, such as shrimp, crab, and lobster. If you choose fish, select types that are higher in omega-3 fatty acids, including salmon, herring, mussels, trout, sardines, and pollock. Make sure that all meats are cooked to food-safe  temperatures. Dairy Pasteurized milk and milk alternatives. Pasteurized yogurt and pasteurized cheese. Cottage cheese. Sour cream. Beverages Water. Juices that contain 100% fruit juice or vegetable juice. Caffeine-free teas and decaffeinated coffee. Drinks that contain caffeine are okay to drink, but it is better to avoid caffeine. Keep your total caffeine intake to less than 200 mg each day (12 oz of coffee, tea, or soda) or as directed by your health care provider. Condiments Any pasteurized condiments. Sweets and Desserts Any sweets and desserts. Fats and Oils Any fats and oils. The items listed above may not be a complete list of recommended foods or beverages. Contact your dietitian for more options. What foods are not recommended? Vegetables Unpasteurized (raw) vegetable juices. Fruits Unpasteurized (raw) fruit juices. Meats and Other Protein Sources Cured meats that have nitrates, such as bacon, salami, and hotdogs. Luncheon meats, bologna, or other deli meats (unless they are reheated until they are steaming hot). Refrigerated pate, meat spreads from a meat counter, smoked seafood that is found in the refrigerated section of a store. Raw fish, such as sushi or sashimi. High mercury content fish, such as tilefish, shark, swordfish, and king mackerel. Raw meats, such as tuna or beef tartare. Undercooked meats and poultry. Make sure that all meats are cooked to food-safe temperatures. Dairy Unpasteurized (raw) milk and any foods that have raw milk in them. Soft cheeses, such as feta, queso blanco, queso fresco, Brie, Camembert cheeses, blue-veined cheeses, and Panela cheese (unless it is made with pasteurized milk, which must be stated on the label). Beverages Alcohol. Sugar-sweetened beverages, such as sodas, teas, or energy drinks. Condiments Homemade fermented foods and drinks, such as pickles, sauerkraut, or kombucha drinks. (Store-bought pasteurized versions of these are  okay.) Other Salads that are made in the store, such as ham salad, chicken salad, egg salad, tuna salad, and seafood salad. The items listed above may not be a complete list of foods and beverages to avoid. Contact your dietitian for more information. This information is not intended to replace advice given to you by your health care provider. Make sure you discuss any questions you have with your health care provider. Document Released: 12/10/2013 Document Revised: 08/03/2015 Document Reviewed: 08/10/2013 Elsevier Interactive Patient Education  2018 [place]. WHAT OB PATIENTS CAN EXPECT   Confirmation of pregnancy and ultrasound ordered if medically indicated-[redacted] weeks gestation  New OB (NOB) intake with nurse and New OB (NOB) labs- [redacted] weeks gestation  New OB (NOB) physical examination with provider- 11/[redacted] weeks gestation  Flu vaccine-[redacted] weeks gestation  Anatomy scan-[redacted] weeks gestation  Glucose tolerance test, blood work to test for anemia, T-dap vaccine-[redacted] weeks gestation  Vaginal swabs/cultures-STD/Group B strep-[redacted] weeks gestation  Appointments every 4 weeks until 28 weeks  Every 2 weeks from 28 weeks until 36 weeks  Weekly visits from 36 weeks until delivery    Breastfeeding Choosing to breastfeed is one of the best decisions you can make for yourself and your baby. A change in hormones during pregnancy causes your breasts to make breast milk in your milk-producing glands. Hormones prevent breast milk from being released before your baby is born. They also prompt  milk flow after birth. Once breastfeeding has begun, thoughts of your baby, as well as his or her sucking or crying, can stimulate the release of milk from your milk-producing glands. Benefits of breastfeeding Research shows that breastfeeding offers many health benefits for infants and mothers. It also offers a cost-free and convenient way to feed your baby. For your baby  Your first milk (colostrum) helps your  baby's digestive system to function better.  Special cells in your milk (antibodies) help your baby to fight off infections.  Breastfed babies are less likely to develop asthma, allergies, obesity, or type 2 diabetes. They are also at lower risk for sudden infant death syndrome (SIDS).  Nutrients in breast milk are better able to meet your baby's needs compared to infant formula.  Breast milk improves your baby's brain development. For you  Breastfeeding helps to create a very special bond between you and your baby.  Breastfeeding is convenient. Breast milk costs nothing and is always available at the correct temperature.  Breastfeeding helps to burn calories. It helps you to lose the weight that you gained during pregnancy.  Breastfeeding makes your uterus return faster to its size before pregnancy. It also slows bleeding (lochia) after you give birth.  Breastfeeding helps to lower your risk of developing type 2 diabetes, osteoporosis, rheumatoid arthritis, cardiovascular disease, and breast, ovarian, uterine, and endometrial cancer later in life. Breastfeeding basics Starting breastfeeding  Find a comfortable place to sit or lie down, with your neck and back well-supported.  Place a pillow or a rolled-up blanket under your baby to bring him or her to the level of your breast (if you are seated). Nursing pillows are specially designed to help support your arms and your baby while you breastfeed.  Make sure that your baby's tummy (abdomen) is facing your abdomen.  Gently massage your breast. With your fingertips, massage from the outer edges of your breast inward toward the nipple. This encourages milk flow. If your milk flows slowly, you may need to continue this action during the feeding.  Support your breast with 4 fingers underneath and your thumb above your nipple (make the letter "C" with your hand). Make sure your fingers are well away from your nipple and your baby's  mouth.  Stroke your baby's lips gently with your finger or nipple.  When your baby's mouth is open wide enough, quickly bring your baby to your breast, placing your entire nipple and as much of the areola as possible into your baby's mouth. The areola is the colored area around your nipple. ? More areola should be visible above your baby's upper lip than below the lower lip. ? Your baby's lips should be opened and extended outward (flanged) to ensure an adequate, comfortable latch. ? Your baby's tongue should be between his or her lower gum and your breast.  Make sure that your baby's mouth is correctly positioned around your nipple (latched). Your baby's lips should create a seal on your breast and be turned out (everted).  It is common for your baby to suck about 2-3 minutes in order to start the flow of breast milk. Latching Teaching your baby how to latch onto your breast properly is very important. An improper latch can cause nipple pain, decreased milk supply, and poor weight gain in your baby. Also, if your baby is not latched onto your nipple properly, he or she may swallow some air during feeding. This can make your baby fussy. Burping your baby when you  switch breasts during the feeding can help to get rid of the air. However, teaching your baby to latch on properly is still the best way to prevent fussiness from swallowing air while breastfeeding. Signs that your baby has successfully latched onto your nipple  Silent tugging or silent sucking, without causing you pain. Infant's lips should be extended outward (flanged).  Swallowing heard between every 3-4 sucks once your milk has started to flow (after your let-down milk reflex occurs).  Muscle movement above and in front of his or her ears while sucking.  Signs that your baby has not successfully latched onto your nipple  Sucking sounds or smacking sounds from your baby while breastfeeding.  Nipple pain.  If you think your  baby has not latched on correctly, slip your finger into the corner of your baby's mouth to break the suction and place it between your baby's gums. Attempt to start breastfeeding again. Signs of successful breastfeeding Signs from your baby  Your baby will gradually decrease the number of sucks or will completely stop sucking.  Your baby will fall asleep.  Your baby's body will relax.  Your baby will retain a small amount of milk in his or her mouth.  Your baby will let go of your breast by himself or herself.  Signs from you  Breasts that have increased in firmness, weight, and size 1-3 hours after feeding.  Breasts that are softer immediately after breastfeeding.  Increased milk volume, as well as a change in milk consistency and color by the fifth day of breastfeeding.  Nipples that are not sore, cracked, or bleeding.  Signs that your baby is getting enough milk  Wetting at least 1-2 diapers during the first 24 hours after birth.  Wetting at least 5-6 diapers every 24 hours for the first week after birth. The urine should be clear or pale yellow by the age of 5 days.  Wetting 6-8 diapers every 24 hours as your baby continues to grow and develop.  At least 3 stools in a 24-hour period by the age of 5 days. The stool should be soft and yellow.  At least 3 stools in a 24-hour period by the age of 7 days. The stool should be seedy and yellow.  No loss of weight greater than 10% of birth weight during the first 3 days of life.  Average weight gain of 4-7 oz (113-198 g) per week after the age of 4 days.  Consistent daily weight gain by the age of 5 days, without weight loss after the age of 2 weeks. After a feeding, your baby may spit up a small amount of milk. This is normal. Breastfeeding frequency and duration Frequent feeding will help you make more milk and can prevent sore nipples and extremely full breasts (breast engorgement). Breastfeed when you feel the need to  reduce the fullness of your breasts or when your baby shows signs of hunger. This is called "breastfeeding on demand." Signs that your baby is hungry include:  Increased alertness, activity, or restlessness.  Movement of the head from side to side.  Opening of the mouth when the corner of the mouth or cheek is stroked (rooting).  Increased sucking sounds, smacking lips, cooing, sighing, or squeaking.  Hand-to-mouth movements and sucking on fingers or hands.  Fussing or crying.  Avoid introducing a pacifier to your baby in the first 4-6 weeks after your baby is born. After this time, you may choose to use a pacifier. Research has  shown that pacifier use during the first year of a baby's life decreases the risk of sudden infant death syndrome (SIDS). Allow your baby to feed on each breast as long as he or she wants. When your baby unlatches or falls asleep while feeding from the first breast, offer the second breast. Because newborns are often sleepy in the first few weeks of life, you may need to awaken your baby to get him or her to feed. Breastfeeding times will vary from baby to baby. However, the following rules can serve as a guide to help you make sure that your baby is properly fed:  Newborns (babies 108 weeks of age or younger) may breastfeed every 1-3 hours.  Newborns should not go without breastfeeding for longer than 3 hours during the day or 5 hours during the night.  You should breastfeed your baby a minimum of 8 times in a 24-hour period.  Breast milk pumping Pumping and storing breast milk allows you to make sure that your baby is exclusively fed your breast milk, even at times when you are unable to breastfeed. This is especially important if you go back to work while you are still breastfeeding, or if you are not able to be present during feedings. Your lactation consultant can help you find a method of pumping that works best for you and give you guidelines about how long it  is safe to store breast milk. Caring for your breasts while you breastfeed Nipples can become dry, cracked, and sore while breastfeeding. The following recommendations can help keep your breasts moisturized and healthy:  Avoid using soap on your nipples.  Wear a supportive bra designed especially for nursing. Avoid wearing underwire-style bras or extremely tight bras (sports bras).  Air-dry your nipples for 3-4 minutes after each feeding.  Use only cotton bra pads to absorb leaked breast milk. Leaking of breast milk between feedings is normal.  Use lanolin on your nipples after breastfeeding. Lanolin helps to maintain your skin's normal moisture barrier. Pure lanolin is not harmful (not toxic) to your baby. You may also hand express a few drops of breast milk and gently massage that milk into your nipples and allow the milk to air-dry.  In the first few weeks after giving birth, some women experience breast engorgement. Engorgement can make your breasts feel heavy, warm, and tender to the touch. Engorgement peaks within 3-5 days after you give birth. The following recommendations can help to ease engorgement:  Completely empty your breasts while breastfeeding or pumping. You may want to start by applying warm, moist heat (in the shower or with warm, water-soaked hand towels) just before feeding or pumping. This increases circulation and helps the milk flow. If your baby does not completely empty your breasts while breastfeeding, pump any extra milk after he or she is finished.  Apply ice packs to your breasts immediately after breastfeeding or pumping, unless this is too uncomfortable for you. To do this: ? Put ice in a plastic bag. ? Place a towel between your skin and the bag. ? Leave the ice on for 20 minutes, 2-3 times a day.  Make sure that your baby is latched on and positioned properly while breastfeeding.  If engorgement persists after 48 hours of following these recommendations,  contact your health care provider or a Science writer. Overall health care recommendations while breastfeeding  Eat 3 healthy meals and 3 snacks every day. Well-nourished mothers who are breastfeeding need an additional 450-500 calories a day.  You can meet this requirement by increasing the amount of a balanced diet that you eat.  Drink enough water to keep your urine pale yellow or clear.  Rest often, relax, and continue to take your prenatal vitamins to prevent fatigue, stress, and low vitamin and mineral levels in your body (nutrient deficiencies).  Do not use any products that contain nicotine or tobacco, such as cigarettes and e-cigarettes. Your baby may be harmed by chemicals from cigarettes that pass into breast milk and exposure to secondhand smoke. If you need help quitting, ask your health care provider.  Avoid alcohol.  Do not use illegal drugs or marijuana.  Talk with your health care provider before taking any medicines. These include over-the-counter and prescription medicines as well as vitamins and herbal supplements. Some medicines that may be harmful to your baby can pass through breast milk.  It is possible to become pregnant while breastfeeding. If birth control is desired, ask your health care provider about options that will be safe while breastfeeding your baby. Where to find more information: [employer]: www.llli.org Contact a health care provider if:  You feel like you want to stop breastfeeding or have become frustrated with breastfeeding.  Your nipples are cracked or bleeding.  Your breasts are red, tender, or warm.  You have: ? Painful breasts or nipples. ? A swollen area on either breast. ? A fever or chills. ? Nausea or vomiting. ? Drainage other than breast milk from your nipples.  Your breasts do not become full before feedings by the fifth day after you give birth.  You feel sad and depressed.  Your baby is: ? Too  sleepy to eat well. ? Having trouble sleeping. ? More than 58 week old and wetting fewer than 6 diapers in a 24-hour period. ? Not gaining weight by 39 days of age.  Your baby has fewer than 3 stools in a 24-hour period.  Your baby's skin or the white parts of his or her eyes become yellow. Get help right away if:  Your baby is overly tired (lethargic) and does not want to wake up and feed.  Your baby develops an unexplained fever. Summary  Breastfeeding offers many health benefits for infant and mothers.  Try to breastfeed your infant when he or she shows early signs of hunger.  Gently tickle or stroke your baby's lips with your finger or nipple to allow the baby to open his or her mouth. Bring the baby to your breast. Make sure that much of the areola is in your baby's mouth. Offer one side and burp the baby before you offer the other side.  Talk with your health care provider or lactation consultant if you have questions or you face problems as you breastfeed. This information is not intended to replace advice given to you by your health care provider. Make sure you discuss any questions you have with your health care provider. Document Released: 02/25/2005 Document Revised: 03/29/2016 Document Reviewed: 03/29/2016 Elsevier Interactive Patient Education  2018 [employer]. Pain Relief During Labor and Delivery Many things can cause pain during labor and delivery, including:  Pressure on bones and ligaments due to the baby moving through the pelvis.  Stretching of tissues due to the baby moving through the birth canal.  Muscle tension due to anxiety or nervousness.  The uterus tightening (contracting) and relaxing to help move the baby.  There are many ways to deal with the pain of labor and delivery. They include:  Taking prenatal classes. Taking these classes helps you know what to expect during your baby's birth. What you learn will increase your confidence and decrease your  anxiety.  Practicing relaxation techniques or doing relaxing activities, such as: ? Focused breathing. ? Meditation. ? Visualization. ? Aroma therapy. ? Listening to your favorite music. ? Hypnosis.  Taking a warm shower or bath (hydrotherapy). This may: ? Provide comfort and relaxation. ? Lessen your perception of pain. ? Decrease the amount of pain medicine needed. ? Decrease the length of labor.  Getting a massage or counterpressure on your back.  Applying warm packs or ice packs.  Changing positions often, moving around, or using a birthing ball.  Getting: ? Pain medicine through an IV or injection into a muscle. ? Pain medicine inserted into your spinal column. ? Injections of sterile water just under the skin on your lower back (intradermal injections). ? Laughing gas (nitrous oxide).  Discuss your pain control options with your health care provider during your prenatal visits. Explore the options offered by your hospital or birth center. What kinds of medicine are available? There are two kinds of medicines that can be used to relieve pain during labor and delivery:  Analgesics. These medicines decrease pain without causing you to lose feeling or the ability to move your muscles.  Anesthetics. These medicines block feeling in the body and can decrease your ability to move freely.  Both of these kinds of medicine can cause minor side effects, such as nausea, trouble concentrating, and sleepiness. They can also decrease the baby's heart rate before birth and affect the baby's breathing rate after birth. For this reason, health care providers are careful about when and how much medicine is given. What are specific medicines and procedures that provide pain relief? Local Anesthetics Local anesthetics are used to numb a small area of the body. They may be used along with another kind of anesthetic or used to numb the nerves of the vagina, cervix, and perineum during the  second stage of labor. General Anesthetics General anesthetics cause you to lose consciousness so you do not feel pain. They are usually only used for an emergency cesarean delivery. General anesthetics are given through an IV tube and a mask. Pudendal Block A pudendal block is a form of local anesthetic. It may be used to relieve the pain associated with pushing or stretching of the perineum at the time of delivery or to further numb the perineum. A pudendal block is done by injecting numbing medicine through the vaginal wall into a nerve in the pelvis. Epidural Analgesia Epidural analgesia is given through a flexible IV catheter that is inserted into the lower back. Numbing medicine is delivered continuously to the area near your spinal column nerves (epidural space). After having this type of analgesia, you may be able to move your legs but you most likely will not be able to walk. Depending on the amount of medicine given, you may lose all feeling in the lower half of your body, or you may retain some level of sensation, including the urge to push. Epidural analgesia can be used to provide pain relief for a vaginal birth. Spinal Block A spinal block is similar to epidural analgesia, but the medicine is injected into the spinal fluid instead of the epidural space. A spinal block is only given once. It starts to relieve pain quickly, but the pain relief lasts only 1-6 hours. Spinal blocks can be used for cesarean deliveries. Combined Spinal-Epidural (CSE)  Block A CSE block combines the effects of a spinal block and epidural analgesia. The spinal block works quickly to block all pain. The epidural analgesia provides continuous pain relief, even after the effects of the spinal block have worn off. This information is not intended to replace advice given to you by your health care provider. Make sure you discuss any questions you have with your health care provider. Document Released: 06/13/2008 Document  Revised: 08/04/2015 Document Reviewed: 07/19/2015 Elsevier Interactive Patient Education  2018 [employer] Inc. WHAT OB PATIENTS CAN EXPECT   Confirmation of pregnancy and ultrasound ordered if medically indicated-[redacted] weeks gestation  New OB (NOB) intake with nurse and New OB (NOB) labs- [redacted] weeks gestation  New OB (NOB) physical examination with provider- 11/[redacted] weeks gestation  Flu vaccine-[redacted] weeks gestation  Anatomy scan-[redacted] weeks gestation  Glucose tolerance test, blood work to test for anemia, T-dap vaccine-[redacted] weeks gestation  Vaginal swabs/cultures-STD/Group B strep-[redacted] weeks gestation  Appointments every 4 weeks until 28 weeks  Every 2 weeks from 28 weeks until 36 weeks  Weekly visits from 36 weeks until delivery  Common Medications Safe in Pregnancy  Acne:      Constipation:  Benzoyl Peroxide     Colace  Clindamycin      Dulcolax Suppository  Topica Erythromycin     Fibercon  Salicylic Acid      Metamucil         Miralax AVOID:        Senakot   Accutane    Cough:  Retin-A       Cough Drops  Tetracycline      Phenergan w/ Codeine if Rx  Minocycline      Robitussin (Plain & DM)  Antibiotics:     Crabs/Lice:  Ceclor       RID  Cephalosporins    AVOID:  E-Mycins      Kwell  Keflex  Macrobid/Macrodantin   Diarrhea:  Penicillin      Kao-Pectate  Zithromax      Imodium AD         PUSH FLUIDS AVOID:       Cipro     Fever:  Tetracycline      Tylenol (Regular or Extra  Minocycline       Strength)  Levaquin      Extra Strength-Do not          Exceed 8 tabs/24 hrs Caffeine:        <263m/day (equiv. To 1 cup of coffee or  approx. 3 12 oz sodas)         Gas: Cold/Hayfever:       Gas-X  Benadryl      Mylicon  Claritin       Phazyme  **Claritin-D        Chlor-Trimeton    Headaches:  Dimetapp      ASA-Free Excedrin  Drixoral-Non-Drowsy     Cold Compress  Mucinex (Guaifenasin)     Tylenol (Regular or Extra  Sudafed/Sudafed-12 Hour     Strength)  **Sudafed PE  Pseudoephedrine   Tylenol Cold & Sinus     Vicks Vapor Rub  Zyrtec  **AVOID if Problems With Blood Pressure         Heartburn: Avoid lying down for at least 1 hour after meals  Aciphex      Maalox     Rash:  Milk of Magnesia     Benadryl    Mylanta       1%  Hydrocortisone Cream  Pepcid  Pepcid Complete   Sleep Aids:  Prevacid      Ambien   Prilosec       Benadryl  Rolaids       Chamomile Tea  Tums (Limit 4/day)     Unisom  Zantac       Tylenol PM         Warm milk-add vanilla or  Hemorrhoids:       Sugar for taste  Anusol/Anusol H.C.  (RX: Analapram 2.5%)  Sugar Substitutes:  Hydrocortisone OTC     Ok in moderation  Preparation H      Tucks        Vaseline lotion applied to tissue with wiping    Herpes:     Throat:  Acyclovir      Oragel  Famvir  Valtrex     Vaccines:         Flu Shot Leg Cramps:       *Gardasil  Benadryl      Hepatitis A         Hepatitis B Nasal Spray:       Pneumovax  Saline Nasal Spray     Polio Booster         Tetanus Nausea:       Tuberculosis test or PPD  Vitamin B6 25 mg TID   AVOID:    Dramamine      *Gardasil  Emetrol       Live Poliovirus  Ginger Root 250 mg QID    MMR (measles, mumps &  High Complex Carbs @ Bedtime    rebella)  Sea Bands-Accupressure    Varicella (Chickenpox)  Unisom 1/2 tab TID     *No known complications           If received before Pain:         Known pregnancy;   Darvocet       Resume series after  Lortab        Delivery  Percocet    Yeast:   Tramadol      Femstat  Tylenol 3      Gyne-lotrimin  Ultram       Monistat  Vicodin           MISC:         All Sunscreens           Hair Coloring/highlights          Insect Repellant's          (Including DEET)         Mystic Tans Third Trimester of Pregnancy The third trimester is from week 29 through week 42, months 7 through 9. This trimester is when your unborn baby (fetus) is growing very fast. At the end of the ninth month, the unborn baby is about 20  inches in length. It weighs about 6-10 pounds. Follow these instructions at home:  Avoid all smoking, herbs, and alcohol. Avoid drugs not approved by your doctor.  Do not use any tobacco products, including cigarettes, chewing tobacco, and electronic cigarettes. If you need help quitting, ask your doctor. You may get counseling or other support to help you quit.  Only take medicine as told by your doctor. Some medicines are safe and some are not during pregnancy.  Exercise only as told by your doctor. Stop exercising if you start having cramps.  Eat regular, healthy meals.  Wear a good support bra if your breasts are tender.  Do not use hot tubs, steam rooms, or saunas.  Wear your seat belt when driving.  Avoid raw meat, uncooked cheese, and liter boxes and soil used by cats.  Take your prenatal vitamins.  Take 1500-2000 milligrams of calcium daily starting at the 20th week of pregnancy until you deliver your baby.  Try taking medicine that helps you poop (stool softener) as needed, and if your doctor approves. Eat more fiber by eating fresh fruit, vegetables, and whole grains. Drink enough fluids to keep your pee (urine) clear or pale yellow.  Take warm water baths (sitz baths) to soothe pain or discomfort caused by hemorrhoids. Use hemorrhoid cream if your doctor approves.  If you have puffy, bulging veins (varicose veins), wear support hose. Raise (elevate) your feet for 15 minutes, 3-4 times a day. Limit salt in your diet.  Avoid heavy lifting, wear low heels, and sit up straight.  Rest with your legs raised if you have leg cramps or low back pain.  Visit your dentist if you have not gone during your pregnancy. Use a soft toothbrush to brush your teeth. Be gentle when you floss.  You can have sex (intercourse) unless your doctor tells you not to.  Do not travel far distances unless you must. Only do so with your doctor's approval.  Take prenatal classes.  Practice  driving to the hospital.  Pack your hospital bag.  Prepare the baby's room.  Go to your doctor visits. Get help if:  You are not sure if you are in labor or if your water has broken.  You are dizzy.  You have mild cramps or pressure in your lower belly (abdominal).  You have a nagging pain in your belly area.  You continue to feel sick to your stomach (nauseous), throw up (vomit), or have watery poop (diarrhea).  You have bad smelling fluid coming from your vagina.  You have pain with peeing (urination). Get help right away if:  You have a fever.  You are leaking fluid from your vagina.  You are spotting or bleeding from your vagina.  You have severe belly cramping or pain.  You lose or gain weight rapidly.  You have trouble catching your breath and have chest pain.  You notice sudden or extreme puffiness (swelling) of your face, hands, ankles, feet, or legs.  You have not felt the baby move in over an hour.  You have severe headaches that do not go away with medicine.  You have vision changes. This information is not intended to replace advice given to you by your health care provider. Make sure you discuss any questions you have with your health care provider. Document Released: 05/22/2009 Document Revised: 08/03/2015 Document Reviewed: 04/28/2012 Elsevier Interactive Patient Education  2017 [employer]. Fluoxetine capsules or tablets (Depression/Mood Disorders) What is this medicine? FLUOXETINE (floo OX e teen) belongs to a class of drugs known as selective serotonin reuptake inhibitors (SSRIs). It helps to treat mood problems such as depression, obsessive compulsive disorder, and panic attacks. It can also treat certain eating disorders. This medicine may be used for other purposes; ask your health care provider or pharmacist if you have questions. COMMON BRAND NAME(S): Prozac What should I tell my health care provider before I take this medicine? They need  to know if you have any of these conditions: -bipolar disorder or a family history of bipolar disorder -bleeding disorders -glaucoma -heart disease -liver disease -low levels of sodium in the blood -seizures -suicidal thoughts, plans,  or attempt; a previous suicide attempt by you or a family member -take MAOIs like Carbex, Eldepryl, Marplan, Nardil, and Parnate -take medicines that treat or prevent blood clots -thyroid disease -an unusual or allergic reaction to fluoxetine, other medicines, foods, dyes, or preservatives -pregnant or trying to get pregnant -breast-feeding How should I use this medicine? Take this medicine by mouth with a glass of water. Follow the directions on the prescription label. You can take this medicine with or without food. Take your medicine at regular intervals. Do not take it more often than directed. Do not stop taking this medicine suddenly except upon the advice of your doctor. Stopping this medicine too quickly may cause serious side effects or your condition may worsen. A special MedGuide will be given to you by the pharmacist with each prescription and refill. Be sure to read this information carefully each time. Talk to your pediatrician regarding the use of this medicine in children. While this drug may be prescribed for children as young as 7 years for selected conditions, precautions do apply. Overdosage: If you think you have taken too much of this medicine contact a poison control center or emergency room at once. NOTE: This medicine is only for you. Do not share this medicine with others. What if I miss a dose? If you miss a dose, skip the missed dose and go back to your regular dosing schedule. Do not take double or extra doses. What may interact with this medicine? Do not take this medicine with any of the following medications: -other medicines containing fluoxetine, like Sarafem or Symbyax -cisapride -linezolid -MAOIs like Carbex, Eldepryl,  Marplan, Nardil, and Parnate -methylene blue (injected into a vein) -pimozide -thioridazine This medicine may also interact with the following medications: -alcohol -amphetamines -aspirin and aspirin-like medicines -carbamazepine -certain medicines for depression, anxiety, or psychotic disturbances -certain medicines for migraine headaches like almotriptan, eletriptan, frovatriptan, naratriptan, rizatriptan, sumatriptan, zolmitriptan -digoxin -diuretics -fentanyl -flecainide -furazolidone -isoniazid -lithium -medicines for sleep -medicines that treat or prevent blood clots like warfarin, enoxaparin, and dalteparin -NSAIDs, medicines for pain and inflammation, like ibuprofen or naproxen -phenytoin -procarbazine -propafenone -rasagiline -ritonavir -supplements like St. John's wort, kava kava, valerian -tramadol -tryptophan -vinblastine This list may not describe all possible interactions. Give your health care provider a list of all the medicines, herbs, non-prescription drugs, or dietary supplements you use. Also tell them if you smoke, drink alcohol, or use illegal drugs. Some items may interact with your medicine. What should I watch for while using this medicine? Tell your doctor if your symptoms do not get better or if they get worse. Visit your doctor or health care professional for regular checks on your progress. Because it may take several weeks to see the full effects of this medicine, it is important to continue your treatment as prescribed by your doctor. Patients and their families should watch out for new or worsening thoughts of suicide or depression. Also watch out for sudden changes in feelings such as feeling anxious, agitated, panicky, irritable, hostile, aggressive, impulsive, severely restless, overly excited and hyperactive, or not being able to sleep. If this happens, especially at the beginning of treatment or after a change in dose, call your health care  professional. Dennis Bast may get drowsy or dizzy. Do not drive, use machinery, or do anything that needs mental alertness until you know how this medicine affects you. Do not stand or sit up quickly, especially if you are an older patient. This reduces the risk of dizzy or  fainting spells. Alcohol may interfere with the effect of this medicine. Avoid alcoholic drinks. Your mouth may get dry. Chewing sugarless gum or sucking hard candy, and drinking plenty of water may help. Contact your doctor if the problem does not go away or is severe. This medicine may affect blood sugar levels. If you have diabetes, check with your doctor or health care professional before you change your diet or the dose of your diabetic medicine. What side effects may I notice from receiving this medicine? Side effects that you should report to your doctor or health care professional as soon as possible: -allergic reactions like skin rash, itching or hives, swelling of the face, lips, or tongue -anxious -black, tarry stools -breathing problems -changes in vision -confusion -elevated mood, decreased need for sleep, racing thoughts, impulsive behavior -eye pain -fast, irregular heartbeat -feeling faint or lightheaded, falls -feeling agitated, angry, or irritable -hallucination, loss of contact with reality -loss of balance or coordination -loss of memory -painful or prolonged erections -restlessness, pacing, inability to keep still -seizures -stiff muscles -suicidal thoughts or other mood changes -trouble sleeping -unusual bleeding or bruising -unusually weak or tired -vomiting Side effects that usually do not require medical attention (report to your doctor or health care professional if they continue or are bothersome): -change in appetite or weight -change in sex drive or performance -diarrhea -dry mouth -headache -increased sweating -nausea -tremors This list may not describe all possible side effects. Call  your doctor for medical advice about side effects. You may report side effects to FDA at 1-800-FDA-1088. Where should I keep my medicine? Keep out of the reach of children. Store at room temperature between 15 and 30 degrees C (59 and 86 degrees F). Throw away any unused medicine after the expiration date. NOTE: This sheet is a summary. It may not cover all possible information. If you have questions about this medicine, talk to your doctor, pharmacist, or health care provider.  2018 Elsevier/Gold Standard (2015-07-29 15:55:27)

## 2018-02-13 NOTE — Progress Notes (Signed)
ROB-Notes feeling "more emotional" and having "crying spells". PHQ-9: 7, findings discussed with patient. Wishes to restart Prozac at this time, see orders. Anticipatory guidance regarding course of prenatal care. Reviewed red flag symptoms and when to call. RTC x 4 weeks for 28 week labs and ROB or sooner if needed.   Depression screen PHQ 2/9 02/13/2018  Decreased Interest 2  Down, Depressed, Hopeless 1  PHQ - 2 Score 3  Altered sleeping 0  Tired, decreased energy 1  Change in appetite 0  Feeling bad or failure about yourself  2  Trouble concentrating 0  Moving slowly or fidgety/restless 1  Suicidal thoughts 0  PHQ-9 Score 7  Difficult doing work/chores Very difficult

## 2018-03-11 NOTE — L&D Delivery Note (Signed)
Delivery Note  2300 In room to see patient reports pelvic pressure and the urge to push. SVE: 9.5/100/0, vertex by RN. Room prepared for second stage  2310 SVE: 10/100/+1, vertex.   Effective maternal pushing efforts noted. Spontaneous vaginal birth of liveborn female patient in left occiput anterior position at 2335. Loose nuchal cord x 1 reduced on perineum. Infant immediately to maternal abdomen. Delayed cord clamping, two (2) vessel cord. AGPARs: 8, 9. Weight: pending. Receiving nurse and NNP present at bedside for birth due to particulate meconium.   Pitocin infusing. Delivery of intact placenta at 2340. Second degree perineal and left vaginal wall laceration repaired with 3-0 vicryl rapide under local anesthesia. Lacerations well approximated and hemostatic. Uterus firm. Lochia small. QBL: 620 ml. Counts correct x 2. Vault check completed.   Initiate routine postpartum care and orders. Mom to postpartum.  Baby to Couplet care / Skin to Skin.  FOB present at bedside and overjoyed with the birth of "Greyson".    Gunnar Bulla, CNM [facility], [facility] 05/30/2018, 12:11 AM

## 2018-03-16 ENCOUNTER — Ambulatory Visit (INDEPENDENT_AMBULATORY_CARE_PROVIDER_SITE_OTHER): Payer: BC Managed Care – PPO | Admitting: Certified Nurse Midwife

## 2018-03-16 ENCOUNTER — Encounter: Payer: Self-pay | Admitting: Certified Nurse Midwife

## 2018-03-16 ENCOUNTER — Other Ambulatory Visit: Payer: BC Managed Care – PPO

## 2018-03-16 VITALS — BP 113/72 | HR 99 | Wt 224.2 lb

## 2018-03-16 DIAGNOSIS — Z130 Encounter for screening for diseases of the blood and blood-forming organs and certain disorders involving the immune mechanism: Secondary | ICD-10-CM

## 2018-03-16 DIAGNOSIS — Z3492 Encounter for supervision of normal pregnancy, unspecified, second trimester: Secondary | ICD-10-CM

## 2018-03-16 DIAGNOSIS — Z113 Encounter for screening for infections with a predominantly sexual mode of transmission: Secondary | ICD-10-CM

## 2018-03-16 DIAGNOSIS — Z131 Encounter for screening for diabetes mellitus: Secondary | ICD-10-CM

## 2018-03-16 LAB — POCT URINALYSIS DIPSTICK OB
Bilirubin, UA: NEGATIVE
Blood, UA: NEGATIVE
GLUCOSE, UA: NEGATIVE
Ketones, UA: NEGATIVE
Leukocytes, UA: NEGATIVE
Nitrite, UA: NEGATIVE
POC,PROTEIN,UA: NEGATIVE
Spec Grav, UA: 1.015 (ref 1.010–1.025)
Urobilinogen, UA: 0.2 E.U./dL
pH, UA: 6.5 (ref 5.0–8.0)

## 2018-03-16 NOTE — Progress Notes (Signed)
ROB doing well. 28 wk labs today. BTC/CBC/RPR/ glucose screen. Tdap -was not available with do at next visit. Sample birth plan, and information on birth control after baby given. Feels good movement. Will follow up with labs. ROB in 2 wks with Melody .   Doreene Burke, CNM

## 2018-03-16 NOTE — Patient Instructions (Signed)
Glucose Tolerance Test During Pregnancy Why am I having this test? The glucose tolerance test (GTT) is done to check how your body processes sugar (glucose). This is one of several tests used to diagnose diabetes that develops during pregnancy (gestational diabetes mellitus). Gestational diabetes is a temporary form of diabetes that some women develop during pregnancy. It usually occurs during the second trimester of pregnancy and goes away after delivery. Testing (screening) for gestational diabetes usually occurs between 24 and 28 weeks of pregnancy. You may have the GTT test after having a 1-hour glucose screening test if the results from that test indicate that you may have gestational diabetes. You may also have this test if:  You have a history of gestational diabetes.  You have a history of giving birth to very large babies or have experienced repeated fetal loss (stillbirth).  You have signs and symptoms of diabetes, such as: ? Changes in your vision. ? Tingling or numbness in your hands or feet. ? Changes in hunger, thirst, and urination that are not otherwise explained by your pregnancy. What is being tested? This test measures the amount of glucose in your blood at different times during a period of 3 hours. This indicates how well your body is able to process glucose. What kind of sample is taken?  Blood samples are required for this test. They are usually collected by inserting a needle into a blood vessel. How do I prepare for this test?  For 3 days before your test, eat normally. Have plenty of carbohydrate-rich foods.  Follow instructions from your health care provider about: ? Eating or drinking restrictions on the day of the test. You may be asked to not eat or drink anything other than water (fast) starting 8-10 hours before the test. ? Changing or stopping your regular medicines. Some medicines may interfere with this test. Tell a health care provider about:  All  medicines you are taking, including vitamins, herbs, eye drops, creams, and over-the-counter medicines.  Any blood disorders you have.  Any surgeries you have had.  Any medical conditions you have. What happens during the test? First, your blood glucose will be measured. This is referred to as your fasting blood glucose, since you fasted before the test. Then, you will drink a glucose solution that contains a certain amount of glucose. Your blood glucose will be measured again 1, 2, and 3 hours after drinking the solution. This test takes about 3 hours to complete. You will need to stay at the testing location during this time. During the testing period:  Do not eat or drink anything other than the glucose solution.  Do not exercise.  Do not use any products that contain nicotine or tobacco, such as cigarettes and e-cigarettes. If you need help stopping, ask your health care provider. The testing procedure may vary among health care providers and hospitals. How are the results reported? Your results will be reported as milligrams of glucose per deciliter of blood (mg/dL) or millimoles per liter (mmol/L). Your health care provider will compare your results to normal ranges that were established after testing a large group of people (reference ranges). Reference ranges may vary among labs and hospitals. For this test, common reference ranges are:  Fasting: less than 95-105 mg/dL (5.3-5.8 mmol/L).  1 hour after drinking glucose: less than 180-190 mg/dL (10.0-10.5 mmol/L).  2 hours after drinking glucose: less than 155-165 mg/dL (8.6-9.2 mmol/L).  3 hours after drinking glucose: 140-145 mg/dL (7.8-8.1 mmol/L). What do the   results mean? Results within reference ranges are considered normal, meaning that your glucose levels are well-controlled. If two or more of your blood glucose levels are high, you may be diagnosed with gestational diabetes. If only one level is high, your health care  provider may suggest repeat testing or other tests to confirm a diagnosis. Talk with your health care provider about what your results mean. Questions to ask your health care provider Ask your health care provider, or the department that is doing the test:  When will my results be ready?  How will I get my results?  What are my treatment options?  What other tests do I need?  What are my next steps? Summary  The glucose tolerance test (GTT) is one of several tests used to diagnose diabetes that develops during pregnancy (gestational diabetes mellitus). Gestational diabetes is a temporary form of diabetes that some women develop during pregnancy.  You may have the GTT test after having a 1-hour glucose screening test if the results from that test indicate that you may have gestational diabetes. You may also have this test if you have any symptoms or risk factors for gestational diabetes.  Talk with your health care provider about what your results mean. This information is not intended to replace advice given to you by your health care provider. Make sure you discuss any questions you have with your health care provider. Document Released: 08/27/2011 Document Revised: 10/07/2016 Document Reviewed: 10/07/2016 Elsevier Interactive Patient Education  2019 Elsevier Inc.  

## 2018-03-17 LAB — CBC
Hematocrit: 29.2 % — ABNORMAL LOW (ref 34.0–46.6)
Hemoglobin: 9.8 g/dL — ABNORMAL LOW (ref 11.1–15.9)
MCH: 30.5 pg (ref 26.6–33.0)
MCHC: 33.6 g/dL (ref 31.5–35.7)
MCV: 91 fL (ref 79–97)
Platelets: 254 10*3/uL (ref 150–450)
RBC: 3.21 x10E6/uL — ABNORMAL LOW (ref 3.77–5.28)
RDW: 12.5 % (ref 11.7–15.4)
WBC: 9.9 10*3/uL (ref 3.4–10.8)

## 2018-03-17 LAB — GLUCOSE, 1 HOUR GESTATIONAL: GESTATIONAL DIABETES SCREEN: 166 mg/dL — AB (ref 65–139)

## 2018-03-17 LAB — RPR: RPR: NONREACTIVE

## 2018-03-20 ENCOUNTER — Encounter: Payer: Self-pay | Admitting: Certified Nurse Midwife

## 2018-03-20 ENCOUNTER — Other Ambulatory Visit: Payer: Self-pay | Admitting: Certified Nurse Midwife

## 2018-03-20 DIAGNOSIS — Z3403 Encounter for supervision of normal first pregnancy, third trimester: Secondary | ICD-10-CM

## 2018-03-20 DIAGNOSIS — O99019 Anemia complicating pregnancy, unspecified trimester: Secondary | ICD-10-CM | POA: Insufficient documentation

## 2018-03-20 DIAGNOSIS — R7309 Other abnormal glucose: Secondary | ICD-10-CM

## 2018-03-31 ENCOUNTER — Ambulatory Visit (INDEPENDENT_AMBULATORY_CARE_PROVIDER_SITE_OTHER): Payer: BC Managed Care – PPO | Admitting: Obstetrics and Gynecology

## 2018-03-31 VITALS — BP 115/76 | HR 116 | Wt 226.7 lb

## 2018-03-31 DIAGNOSIS — Z23 Encounter for immunization: Secondary | ICD-10-CM

## 2018-03-31 DIAGNOSIS — O09899 Supervision of other high risk pregnancies, unspecified trimester: Secondary | ICD-10-CM

## 2018-03-31 DIAGNOSIS — Z3493 Encounter for supervision of normal pregnancy, unspecified, third trimester: Secondary | ICD-10-CM

## 2018-03-31 MED ORDER — TETANUS-DIPHTH-ACELL PERTUSSIS 5-2.5-18.5 LF-MCG/0.5 IM SUSP
0.5000 mL | Freq: Once | INTRAMUSCULAR | Status: AC
  Administered 2018-03-31: 0.5 mL via INTRAMUSCULAR

## 2018-03-31 NOTE — Progress Notes (Signed)
ROB-pt is doing well, tdap given

## 2018-03-31 NOTE — Progress Notes (Signed)
ROB-will do growth scan with 3 hr GTT, discussed iron rich diet, still has to enroll in classes.

## 2018-03-31 NOTE — Patient Instructions (Signed)

## 2018-04-01 ENCOUNTER — Ambulatory Visit (INDEPENDENT_AMBULATORY_CARE_PROVIDER_SITE_OTHER): Payer: BC Managed Care – PPO

## 2018-04-01 ENCOUNTER — Other Ambulatory Visit: Payer: BC Managed Care – PPO

## 2018-04-01 ENCOUNTER — Ambulatory Visit (INDEPENDENT_AMBULATORY_CARE_PROVIDER_SITE_OTHER): Payer: BC Managed Care – PPO | Admitting: Obstetrics and Gynecology

## 2018-04-01 DIAGNOSIS — R7309 Other abnormal glucose: Secondary | ICD-10-CM

## 2018-04-01 DIAGNOSIS — O09899 Supervision of other high risk pregnancies, unspecified trimester: Secondary | ICD-10-CM

## 2018-04-01 DIAGNOSIS — Z3403 Encounter for supervision of normal first pregnancy, third trimester: Secondary | ICD-10-CM

## 2018-04-01 DIAGNOSIS — Z3493 Encounter for supervision of normal pregnancy, unspecified, third trimester: Secondary | ICD-10-CM

## 2018-04-01 NOTE — Progress Notes (Signed)
Here for 3 h GTT and to review ultrasound findings:  Indications:growth/afi Findings:  Singleton intrauterine pregnancy is visualized with FHR at 131 BPM. Biometrics give an (U/S) Gestational age of [redacted]w[redacted]d and an (U/S) EDD of 05/07/18; this NOT correlates with the clinically established Estimated Date of Delivery: 05/30/18. Fetal size >fetal age by 24 days Fetal presentation is Cephalic.  Placenta: posterior. Grade: 1 AFI: 9.1 cm  Growth percentile is 89. EFW: 5lb9oz/2512g (document both grams and pounds)  Impression: 1. [redacted]w[redacted]d Viable Singleton Intrauterine pregnancy previously established criteria. 2. Growth is 89 %ile.  AFI is 9.1 cm.  3. Fetal size >fetal age by 24 days  Discussed findings and will repeat growth scan in 4 weeks. Discussed adhering to low carb diet and regular exercise for remainder of pregnancy. Will follow up more when labs are in.  Melody Shambley,CNM

## 2018-04-01 NOTE — Patient Instructions (Signed)
Gestational Diabetes Mellitus, Diagnosis Gestational diabetes (gestational diabetes mellitus) is a short-term (temporary) form of diabetes that can happen during pregnancy. It goes away after you give birth. It may be caused by one or both of these problems:  Your pancreas does not make enough of a hormone called insulin.  Your body does not respond in a normal way to insulin that it makes. Insulin lets sugars (glucose) go into cells in the body. This gives you energy. If you have diabetes, sugars cannot get into cells. This causes high blood sugar (hyperglycemia). If you get gestational diabetes, you are:  More likely to get it if you get pregnant again.  More likely to develop type 2 diabetes in the future. If gestational diabetes is treated, it may not hurt you or your baby. Your doctor will set treatment goals for you. In general, you should have these blood sugar levels:  After not eating for a long time (fasting): 95 mg/dL (5.3 mmol/L).  After meals (postprandial): ? One hour after a meal: at or below 140 mg/dL (7.8 mmol/L). ? Two hours after a meal: at or below 120 mg/dL (6.7 mmol/L).  A1c (hemoglobin A1c) level: 6-6.5%. Follow these instructions at home: Questions to ask your doctor   You may want to ask these questions: ? Do I need to meet with a diabetes educator? ? What equipment will I need to care for myself at home? ? What medicines do I need? When should I take them? ? How often do I need to check my blood sugar? ? What number can I call if I have questions? ? When is my next doctor's visit? General instructions  Take over-the-counter and prescription medicines only as told by your doctor.  Stay at a healthy weight during pregnancy.  Keep all follow-up visits as told by your doctor. This is important. Contact a doctor if:  Your blood sugar is at or above 240 mg/dL (13.3 mmol/L).  Your blood sugar is at or above 200 mg/dL (11.1 mmol/L) and you have ketones in  your pee (urine).  You have been sick or have had a fever for 2 days or more and you are not getting better.  You have any of these problems for more than 6 hours: ? You cannot eat or drink. ? You feel sick to your stomach (nauseous). ? You throw up (vomit). ? You have watery poop (diarrhea). Get help right away if:  Your blood sugar is lower than 54 mg/dL (3 mmol/L).  You get confused.  You have trouble: ? Thinking clearly. ? Breathing.  Your baby moves less than normal.  You have any of these: ? Moderate or large ketone levels in your pee. ? Blood coming from your vagina. ? Unusual fluid coming from your vagina. ? Early contractions. These may feel like tightness in your belly. Summary  Gestational diabetes is a short-term form of diabetes. It can happen while you are pregnant. It goes away after you give birth.  If gestational diabetes is treated, it may not hurt you or your baby. Your doctor will set treatment goals for you.  Keep all follow-up visits as told by your doctor. This is important. This information is not intended to replace advice given to you by your health care provider. Make sure you discuss any questions you have with your health care provider. Document Released: 06/19/2015 Document Revised: 10/21/2016 Document Reviewed: 03/31/2015 Elsevier Interactive Patient Education  2019 Elsevier Inc.  

## 2018-04-02 ENCOUNTER — Other Ambulatory Visit: Payer: Self-pay | Admitting: Certified Nurse Midwife

## 2018-04-02 DIAGNOSIS — Z3A31 31 weeks gestation of pregnancy: Secondary | ICD-10-CM

## 2018-04-02 DIAGNOSIS — O09299 Supervision of pregnancy with other poor reproductive or obstetric history, unspecified trimester: Secondary | ICD-10-CM | POA: Insufficient documentation

## 2018-04-02 DIAGNOSIS — O24419 Gestational diabetes mellitus in pregnancy, unspecified control: Secondary | ICD-10-CM

## 2018-04-02 DIAGNOSIS — Z8632 Personal history of gestational diabetes: Secondary | ICD-10-CM | POA: Insufficient documentation

## 2018-04-02 LAB — GESTATIONAL GLUCOSE TOLERANCE
GLUCOSE 3 HOUR GTT: 125 mg/dL (ref 65–139)
Glucose, Fasting: 90 mg/dL (ref 65–94)
Glucose, GTT - 1 Hour: 191 mg/dL — ABNORMAL HIGH (ref 65–179)
Glucose, GTT - 2 Hour: 166 mg/dL — ABNORMAL HIGH (ref 65–154)

## 2018-04-06 ENCOUNTER — Encounter: Payer: Self-pay | Admitting: Dietician

## 2018-04-06 ENCOUNTER — Encounter: Payer: BC Managed Care – PPO | Attending: Obstetrics and Gynecology | Admitting: Dietician

## 2018-04-06 VITALS — BP 116/72 | Ht 66.0 in | Wt 227.4 lb

## 2018-04-06 DIAGNOSIS — O24410 Gestational diabetes mellitus in pregnancy, diet controlled: Secondary | ICD-10-CM

## 2018-04-06 DIAGNOSIS — Z3A31 31 weeks gestation of pregnancy: Secondary | ICD-10-CM | POA: Insufficient documentation

## 2018-04-06 DIAGNOSIS — O24419 Gestational diabetes mellitus in pregnancy, unspecified control: Secondary | ICD-10-CM | POA: Insufficient documentation

## 2018-04-06 DIAGNOSIS — Z713 Dietary counseling and surveillance: Secondary | ICD-10-CM | POA: Insufficient documentation

## 2018-04-06 NOTE — Progress Notes (Addendum)
Appt. Start Time: 1030 Appt. End Time: 1200  EDC 05-31-18; G1P0A0; plans to breastfeed  GDM Class 1 Diabetes Overview - define DM; state own type of DM; identify functions of pancreas and insulin; define insulin deficiency vs insulin resistance  Psychosocial - identify DM as a source of stress; state the effects of stress on BG control; verbalize appropriate stress management techniques; identify personal stress issues   Nutritional Management - describe effects of food on blood glucose; identify sources of carbohydrate, protein and fat; verbalize the importance of balance meals in controlling blood glucose; identify meals as well balanced or not; estimate servings of carbohydrate from menus; use food labels to identify servings size, content of carbohydrate, fiber, protein, fat, saturated fat and sodium; recognize food sources of fat, saturated fat, trans fat, sodium and verbalize goals for intake; describe healthful appropriate food choices when dining out   Exercise - describe the effects of exercise on blood glucose and importance of regular exercise in controlling diabetes; state a plan for personal exercise; verbalize contraindications for exercise  Medications - state name, dose, timing of currently prescribed medications; describe types of medications available for diabetes  Insulin Training - prepare and administer insulin accurately; state correct rotation pattern; verbalize safe and lawful needle disposal  Self-Monitoring - state importance of HBGM and demo procedure accurately; use HBGM results to effectively manage diabetes; identify importance of regular HbA1C testing and goals for results  Acute Complications/Sick Day Guidelines - recognize hyperglycemia and hypoglycemia with causes and effects; identify blood glucose results as high, low or in control; list steps in treating and preventing high and low blood glucose; state appropriate measure to manage blood glucose when ill (need  for meds, HBGM plan, when to call physician, need for fluids)  Chronic Complications/Foot, Skin, Eye Dental Care - identify possible long-term complications of diabetes (retinopathy, neuropathy, nephropathy, cardiovascular disease, infections); explain steps in prevention and treatment of chronic complications; state importance of daily self-foot exams; describe how to examine feet and what to look for; explain appropriate eye and dental care  Lifestyle Changes/Goals & Health/Community Resources - state benefits of making appropriate lifestyle changes; identify habits that need to change (meals, tobacco, alcohol); identify strategies to reduce risk factors for personal health; set goals for proper diabetes care; state need for and frequency of healthcare follow-up; describe appropriate community resources for good health (ADA, web sites, apps)   Pregnancy/Sexual Health - define gestational diabetes; state importance of good blood glucose control and birth control prior to pregnancy; state importance of good blood glucose control in preventing sexual problems (impotence, vaginal dryness, infections, loss of desire); state relationship of blood glucose control and pregnancy outcome; describe risk of maternal and fetal complications  Teaching Materials Used: Meter-One Touch Verio Flex General Meal Planning Guidelines Food Group handout Daily Food Record Gestational Diabetes Booklet Gestational Video Goals for Healthy Pregnancy

## 2018-04-07 ENCOUNTER — Other Ambulatory Visit: Payer: Self-pay | Admitting: *Deleted

## 2018-04-07 MED ORDER — GLUCOSE BLOOD VI STRP: ORAL_STRIP | 12 refills | Status: DC

## 2018-04-07 MED ORDER — ONETOUCH ULTRASOFT LANCETS MISC: 12 refills | Status: DC

## 2018-04-14 ENCOUNTER — Encounter: Payer: Self-pay | Admitting: Dietician

## 2018-04-14 ENCOUNTER — Encounter: Payer: BC Managed Care – PPO | Attending: Obstetrics and Gynecology | Admitting: Dietician

## 2018-04-14 VITALS — BP 118/72 | Ht 66.0 in | Wt 225.0 lb

## 2018-04-14 DIAGNOSIS — Z713 Dietary counseling and surveillance: Secondary | ICD-10-CM | POA: Diagnosis not present

## 2018-04-14 DIAGNOSIS — O24419 Gestational diabetes mellitus in pregnancy, unspecified control: Secondary | ICD-10-CM | POA: Diagnosis not present

## 2018-04-14 DIAGNOSIS — O24410 Gestational diabetes mellitus in pregnancy, diet controlled: Secondary | ICD-10-CM

## 2018-04-14 DIAGNOSIS — Z3A31 31 weeks gestation of pregnancy: Secondary | ICD-10-CM | POA: Diagnosis not present

## 2018-04-14 NOTE — Progress Notes (Signed)
   Patient's BG record indicates BGs are within goal ranges.  Patient's food diary indicates well balanced meals; patient is following recommendations precisely; but states she would like to increase variety as most meals have been the same.    Provided 1800kcal meal plan, and wrote individualized menus based on patient's food preferences. Provided additional sample menus.   Instructed patient on food safety, including avoidance of Listeriosis, and limiting mercury from fish.  Discussed importance of maintaining healthy lifestyle habits to reduce risk of Type 2 DM as well as Gestational DM with any future pregnancies.  Advised patient to use any remaining testing supplies to test some BGs after delivery, and to have BG tested ideally annually, as well as prior to attempting future pregnancies.

## 2018-04-14 NOTE — Patient Instructions (Signed)
   Keep up with your healthy food choices, great job!  Use menus for more meal and snack ideas.

## 2018-04-17 ENCOUNTER — Ambulatory Visit (INDEPENDENT_AMBULATORY_CARE_PROVIDER_SITE_OTHER): Payer: BC Managed Care – PPO | Admitting: Certified Nurse Midwife

## 2018-04-17 ENCOUNTER — Other Ambulatory Visit: Payer: Self-pay

## 2018-04-17 VITALS — BP 112/74 | HR 96 | Wt 223.4 lb

## 2018-04-17 DIAGNOSIS — Z3493 Encounter for supervision of normal pregnancy, unspecified, third trimester: Secondary | ICD-10-CM

## 2018-04-17 DIAGNOSIS — O24410 Gestational diabetes mellitus in pregnancy, diet controlled: Secondary | ICD-10-CM

## 2018-04-17 LAB — POCT URINALYSIS DIPSTICK OB
Bilirubin, UA: NEGATIVE
Blood, UA: NEGATIVE
Glucose, UA: NEGATIVE
Nitrite, UA: NEGATIVE
SPEC GRAV UA: 1.01 (ref 1.010–1.025)
Urobilinogen, UA: 0.2 E.U./dL
pH, UA: 6.5 (ref 5.0–8.0)

## 2018-04-17 MED ORDER — FLUOXETINE HCL 10 MG PO CAPS: 10.0000 mg | ORAL_CAPSULE | Freq: Every day | ORAL | 6 refills | Status: DC

## 2018-04-17 NOTE — Progress Notes (Signed)
ROB-Doing well. Blood sugar log from 01/27-02/08/2018 submitted. All FSBG wnl except two (2) fasting and four (4) PP. Advised continue lifestyle and diet modifications. Anticipatory guidance regarding course of prenatal care and antenatal testing recommendations due to diet controlled GDM; pt and spouse verbalized understanding. Reviewed red flag symptoms and when to call. RTC x 2-3 weeks for growth ultrasound, 36 week cultures, and ROB or sooner if needed.

## 2018-04-17 NOTE — Progress Notes (Signed)
ROB-No complaints.  

## 2018-04-17 NOTE — Patient Instructions (Addendum)
WE WOULD LOVE TO HEAR FROM YOU!!!!   Thank you Crystal Shepard for visiting [facility].  Providing our patients with the best experience possible is really important to [place], and we hope that you felt that on your recent visit. The most valuable feedback we get comes from YOU!!    If you receive a survey please take a couple of minutes to let us know how we did.Thank you for continuing to trust [place] with your care.   [facility]   Vaginal Delivery  Vaginal delivery means that you give birth by pushing your baby out of your birth canal (vagina). A team of health care providers will help you before, during, and after vaginal delivery. Birth experiences are unique for every woman and every pregnancy, and birth experiences vary depending on where you choose to give birth. What happens when I arrive at the birth center or hospital? Once you are in labor and have been admitted into the hospital or birth center, your health care provider may:  Review your pregnancy history and any concerns that you have.  Insert an IV into one of your veins. This may be used to give you fluids and medicines.  Check your blood pressure, pulse, temperature, and heart rate (vital signs).  Check whether your bag of water (amniotic sac) has broken (ruptured).  Talk with you about your birth plan and discuss pain control options. Monitoring Your health care provider may monitor your contractions (uterine monitoring) and your baby's heart rate (fetal monitoring). You may need to be monitored:  Often, but not continuously (intermittently).  All the time or for long periods at a time (continuously). Continuous monitoring may be needed if: ? You are taking certain medicines, such as medicine to relieve pain or make your contractions stronger. ? You have pregnancy or labor complications. Monitoring may be done by:  Placing a special stethoscope or a handheld monitoring device on  your abdomen to check your baby's heartbeat and to check for contractions.  Placing monitors on your abdomen (external monitors) to record your baby's heartbeat and the frequency and length of contractions.  Placing monitors inside your uterus through your vagina (internal monitors) to record your baby's heartbeat and the frequency, length, and strength of your contractions. Depending on the type of monitor, it may remain in your uterus or on your baby's head until birth.  Telemetry. This is a type of continuous monitoring that can be done with external or internal monitors. Instead of having to stay in bed, you are able to move around during telemetry. Physical exam Your health care provider may perform frequent physical exams. This may include:  Checking how and where your baby is positioned in your uterus.  Checking your cervix to determine: ? Whether it is thinning out (effacing). ? Whether it is opening up (dilating). What happens during labor and delivery?  Normal labor and delivery is divided into the following three stages: Stage 1  This is the longest stage of labor.  This stage can last for hours or days.  Throughout this stage, you will feel contractions. Contractions generally feel mild, infrequent, and irregular at first. They get stronger, more frequent (about every 2-3 minutes), and more regular as you move through this stage.  This stage ends when your cervix is completely dilated to 4 inches (10 cm) and completely effaced. Stage 2  This stage starts once your cervix is completely effaced and dilated and lasts until the delivery of your baby.  This stage may last from 20 minutes to 2 hours.  This is the stage where you will feel an urge to push your baby out of your vagina.  You may feel stretching and burning pain, especially when the widest part of your baby's head passes through the vaginal opening (crowning).  Once your baby is delivered, the umbilical cord  will be clamped and cut. This usually occurs after waiting a period of 1-2 minutes after delivery.  Your baby will be placed on your bare chest (skin-to-skin contact) in an upright position and covered with a warm blanket. Watch your baby for feeding cues, like rooting or sucking, and help the baby to your breast for his or her first feeding. Stage 3  This stage starts immediately after the birth of your baby and ends after you deliver the placenta.  This stage may take anywhere from 5 to 30 minutes.  After your baby has been delivered, you will feel contractions as your body expels the placenta and your uterus contracts to control bleeding. What can I expect after labor and delivery?  After labor is over, you and your baby will be monitored closely until you are ready to go home to ensure that you are both healthy. Your health care team will teach you how to care for yourself and your baby.  You and your baby will stay in the same room (rooming in) during your hospital stay. This will encourage early bonding and successful breastfeeding.  You may continue to receive fluids and medicines through an IV.  Your uterus will be checked and massaged regularly (fundal massage).  You will have some soreness and pain in your abdomen, vagina, and the area of skin between your vaginal opening and your anus (perineum).  If an incision was made near your vagina (episiotomy) or if you had some vaginal tearing during delivery, cold compresses may be placed on your episiotomy or your tear. This helps to reduce pain and swelling.  You may be given a squirt bottle to use instead of wiping when you go to the bathroom. To use the squirt bottle, follow these steps: ? Before you urinate, fill the squirt bottle with warm water. Do not use hot water. ? After you urinate, while you are sitting on the toilet, use the squirt bottle to rinse the area around your urethra and vaginal opening. This rinses away any urine  and blood. ? Fill the squirt bottle with clean water every time you use the bathroom.  It is normal to have vaginal bleeding after delivery. Wear a sanitary pad for vaginal bleeding and discharge. Summary  Vaginal delivery means that you will give birth by pushing your baby out of your birth canal (vagina).  Your health care provider may monitor your contractions (uterine monitoring) and your baby's heart rate (fetal monitoring).  Your health care provider may perform a physical exam.  Normal labor and delivery is divided into three stages.  After labor is over, you and your baby will be monitored closely until you are ready to go home. This information is not intended to replace advice given to you by your health care provider. Make sure you discuss any questions you have with your health care provider. Document Released: 12/05/2007 Document Revised: 04/01/2017 Document Reviewed: 04/01/2017 Elsevier Interactive Patient Education  2019 [employer].

## 2018-05-06 ENCOUNTER — Ambulatory Visit (INDEPENDENT_AMBULATORY_CARE_PROVIDER_SITE_OTHER): Payer: BC Managed Care – PPO

## 2018-05-06 ENCOUNTER — Ambulatory Visit (INDEPENDENT_AMBULATORY_CARE_PROVIDER_SITE_OTHER): Payer: BC Managed Care – PPO | Admitting: Certified Nurse Midwife

## 2018-05-06 VITALS — BP 107/71 | HR 109 | Wt 229.2 lb

## 2018-05-06 DIAGNOSIS — O24410 Gestational diabetes mellitus in pregnancy, diet controlled: Secondary | ICD-10-CM

## 2018-05-06 DIAGNOSIS — Z3493 Encounter for supervision of normal pregnancy, unspecified, third trimester: Secondary | ICD-10-CM

## 2018-05-06 DIAGNOSIS — Z3A36 36 weeks gestation of pregnancy: Secondary | ICD-10-CM

## 2018-05-06 LAB — POCT URINALYSIS DIPSTICK OB
Bilirubin, UA: NEGATIVE
Blood, UA: NEGATIVE
Glucose, UA: NEGATIVE
Ketones, UA: NEGATIVE
Leukocytes, UA: NEGATIVE
NITRITE UA: NEGATIVE
POC,PROTEIN,UA: NEGATIVE
Spec Grav, UA: 1.02 (ref 1.010–1.025)
Urobilinogen, UA: 0.2 E.U./dL
pH, UA: 6.5 (ref 5.0–8.0)

## 2018-05-06 NOTE — Progress Notes (Signed)
ROB doing well. Feels good movement. U/S today for growth 55% ( see below). Results reviewed with pt. GBS and cultures today SVE 1/50/high -not able to feel presenting part. BS fasting 83-99, 2hr PP-100-115.  Follow up 1 wk NST and ROB with Melody   Patient Name: Crystal Shepard DOB: April 03, 1995 MRN: [id number]  ULTRASOUND REPORT  Location:[facility] Date of Service: 05/06/2018   Indications:growth/afi Findings:  Mason Jim intrauterine pregnancy is visualized with FHR at 136 BPM. Biometrics give an (U/S) Gestational age of [redacted]w[redacted]d and an (U/S) EDD of 05/27/18; this correlates with the clinically established Estimated Date of Delivery: 05/30/18.  Fetal presentation is Cephalic.  Placenta: posterior. Grade: 2 AFI: 10.9 cm  Growth percentile is 55. EFW: 6lb13oz/3083g (document both grams and pounds)  Impression: 1. [redacted]w[redacted]d Viable Singleton Intrauterine pregnancy previously established criteria. 2. Growth is 55 %ile.  AFI is 10.9 cm.   Recommendations: 1.Clinical correlation with the patient's History and Physical Exam. 2.   Augustine Radar, RDMS

## 2018-05-06 NOTE — Patient Instructions (Signed)
Gestational Diabetes Mellitus, Diagnosis Gestational diabetes (gestational diabetes mellitus) is a short-term (temporary) form of diabetes that can happen during pregnancy. It goes away after you give birth. It may be caused by one or both of these problems:  Your pancreas does not make enough of a hormone called insulin.  Your body does not respond in a normal way to insulin that it makes. Insulin lets sugars (glucose) go into cells in the body. This gives you energy. If you have diabetes, sugars cannot get into cells. This causes high blood sugar (hyperglycemia). If you get gestational diabetes, you are:  More likely to get it if you get pregnant again.  More likely to develop type 2 diabetes in the future. If gestational diabetes is treated, it may not hurt you or your baby. Your doctor will set treatment goals for you. In general, you should have these blood sugar levels:  After not eating for a long time (fasting): 95 mg/dL (5.3 mmol/L).  After meals (postprandial): ? One hour after a meal: at or below 140 mg/dL (7.8 mmol/L). ? Two hours after a meal: at or below 120 mg/dL (6.7 mmol/L).  A1c (hemoglobin A1c) level: 6-6.5%. Follow these instructions at home: Questions to ask your doctor   You may want to ask these questions: ? Do I need to meet with a diabetes educator? ? What equipment will I need to care for myself at home? ? What medicines do I need? When should I take them? ? How often do I need to check my blood sugar? ? What number can I call if I have questions? ? When is my next doctor's visit? General instructions  Take over-the-counter and prescription medicines only as told by your doctor.  Stay at a healthy weight during pregnancy.  Keep all follow-up visits as told by your doctor. This is important. Contact a doctor if:  Your blood sugar is at or above 240 mg/dL (13.3 mmol/L).  Your blood sugar is at or above 200 mg/dL (11.1 mmol/L) and you have ketones in  your pee (urine).  You have been sick or have had a fever for 2 days or more and you are not getting better.  You have any of these problems for more than 6 hours: ? You cannot eat or drink. ? You feel sick to your stomach (nauseous). ? You throw up (vomit). ? You have watery poop (diarrhea). Get help right away if:  Your blood sugar is lower than 54 mg/dL (3 mmol/L).  You get confused.  You have trouble: ? Thinking clearly. ? Breathing.  Your baby moves less than normal.  You have any of these: ? Moderate or large ketone levels in your pee. ? Blood coming from your vagina. ? Unusual fluid coming from your vagina. ? Early contractions. These may feel like tightness in your belly. Summary  Gestational diabetes is a short-term form of diabetes. It can happen while you are pregnant. It goes away after you give birth.  If gestational diabetes is treated, it may not hurt you or your baby. Your doctor will set treatment goals for you.  Keep all follow-up visits as told by your doctor. This is important. This information is not intended to replace advice given to you by your health care provider. Make sure you discuss any questions you have with your health care provider. Document Released: 06/19/2015 Document Revised: 10/21/2016 Document Reviewed: 03/31/2015 Elsevier Interactive Patient Education  2019 Elsevier Inc.  

## 2018-05-08 LAB — STREP GP B NAA: Strep Gp B NAA: NEGATIVE

## 2018-05-09 LAB — GC/CHLAMYDIA PROBE AMP
Chlamydia trachomatis, NAA: NEGATIVE
Neisseria gonorrhoeae by PCR: NEGATIVE

## 2018-05-14 ENCOUNTER — Ambulatory Visit (INDEPENDENT_AMBULATORY_CARE_PROVIDER_SITE_OTHER): Payer: BC Managed Care – PPO | Admitting: Obstetrics and Gynecology

## 2018-05-14 ENCOUNTER — Other Ambulatory Visit: Payer: BC Managed Care – PPO

## 2018-05-14 VITALS — BP 126/73 | HR 101

## 2018-05-14 DIAGNOSIS — Z3493 Encounter for supervision of normal pregnancy, unspecified, third trimester: Secondary | ICD-10-CM

## 2018-05-14 NOTE — Progress Notes (Signed)
ROB & NST- doing well, NST performed today was reviewed and was found to be reactive. Baseline 135-145 with Moderate variability; No decels noted.  Continue recommended antenatal testing and prenatal care. States FBS 85-97 and PP 110-115. Denies any concerns.

## 2018-05-14 NOTE — Progress Notes (Signed)
ROB NST 

## 2018-05-21 ENCOUNTER — Other Ambulatory Visit: Payer: Self-pay

## 2018-05-21 ENCOUNTER — Other Ambulatory Visit: Payer: BC Managed Care – PPO

## 2018-05-21 ENCOUNTER — Ambulatory Visit (INDEPENDENT_AMBULATORY_CARE_PROVIDER_SITE_OTHER): Payer: BC Managed Care – PPO | Admitting: Certified Nurse Midwife

## 2018-05-21 VITALS — BP 106/71 | HR 90 | Wt 233.3 lb

## 2018-05-21 DIAGNOSIS — O24410 Gestational diabetes mellitus in pregnancy, diet controlled: Secondary | ICD-10-CM | POA: Diagnosis not present

## 2018-05-21 DIAGNOSIS — Z3493 Encounter for supervision of normal pregnancy, unspecified, third trimester: Secondary | ICD-10-CM

## 2018-05-21 LAB — POCT URINALYSIS DIPSTICK OB
Bilirubin, UA: NEGATIVE
Blood, UA: NEGATIVE
Glucose, UA: NEGATIVE
KETONES UA: NEGATIVE
Leukocytes, UA: NEGATIVE
Nitrite, UA: NEGATIVE
POC,PROTEIN,UA: NEGATIVE
Spec Grav, UA: 1.025 (ref 1.010–1.025)
UROBILINOGEN UA: 0.2 U/dL
pH, UA: 6.5 (ref 5.0–8.0)

## 2018-05-21 NOTE — Patient Instructions (Addendum)
OUTPATIENT FOLEY BULB INDUCTION OF LABOR:  Information Sheet for Mothers and Family               What's a Foley Bulb Induction?   A Foley bulb induction is a procedure where your provider inserts a catheter into your cervix. Once inside your womb, your provider inflates the balloon with a saline solution.  This puts pressure on your cervix and encourages dilation. The catheter falls out once your cervix dilates to 3-4 centimeters.  Your provider will likely use this method in conjunction with labor-inducing medications such as Pitocin. With any procedure, it's important that you know what to expect. The insertion of a Foley catheter can be a bit uncomfortable, and some women experience sharp pelvic pain. The pain may subside once the catheter is in place. You may experience some cramping when the Foley catheter is in place.  This is normal.     GO TO LABOR AND DELIVERY FOR THE FOLLOWING: . Heavy vaginal bleeding . Rupture of membranes (fluid that wets your underwear) . Painful uterine contractions every 5 minutes or less . Severe abdominal discomfort . Decreased movement of the baby           Labor Induction  Labor induction is when steps are taken to cause a pregnant woman to begin the labor process. Most women go into labor on their own between 37 weeks and 42 weeks of pregnancy. When this does not happen or when there is a medical need for labor to begin, steps may be taken to induce labor. Labor induction causes a pregnant woman's uterus to contract. It also causes the cervix to soften (ripen), open (dilate), and thin out (efface). Usually, labor is not induced before 39 weeks of pregnancy unless there is a medical reason to do so. Your health care provider will determine if labor induction is needed. Before inducing labor, your health care provider will consider a number of factors, including:  Your medical condition and your baby's.  How many weeks along you are in  your pregnancy.  How mature your baby's lungs are.  The condition of your cervix.  The position of your baby.  The size of your birth canal. What are some reasons for labor induction? Labor may be induced if:  Your health or your baby's health is at risk.  Your pregnancy is overdue by 1 week or more.  Your water breaks but labor does not start on its own.  There is a low amount of amniotic fluid around your baby. You may also choose (elect) to have labor induced at a certain time. Generally, elective labor induction is done no earlier than 39 weeks of pregnancy. What methods are used for labor induction? Methods used for labor induction include:  Prostaglandin medicine. This medicine starts contractions and causes the cervix to dilate and ripen. It can be taken by mouth (orally) or by being inserted into the vagina (suppository).  Inserting a small, thin tube (catheter) with a balloon into the vagina and then expanding the balloon with water to dilate the cervix.  Stripping the membranes. In this method, your health care provider gently separates amniotic sac tissue from the cervix. This causes the cervix to stretch, which in turn causes the release of a hormone called progesterone. The hormone causes the uterus to contract. This procedure is often done during an office visit, after which you will be sent home to wait for contractions to begin.  Breaking the water.  In this method, your health care provider uses a small instrument to make a small hole in the amniotic sac. This eventually causes the amniotic sac to break. Contractions should begin after a few hours.  Medicine to trigger or strengthen contractions. This medicine is given through an IV that is inserted into a vein in your arm. Except for membrane stripping, which can be done in a clinic, labor induction is done in the hospital so that you and your baby can be carefully monitored. How long does it take for labor to be  induced? The length of time it takes to induce labor depends on how ready your body is for labor. Some inductions can take up to 2-3 days, while others may take less than a day. Induction may take longer if:  You are induced early in your pregnancy.  It is your first pregnancy.  Your cervix is not ready. What are some risks associated with labor induction? Some risks associated with labor induction include:  Changes in fetal heart rate, such as being too high, too low, or irregular (erratic).  Failed induction.  Infection in the mother or the baby.  Increased risk of having a cesarean delivery.  Fetal death.  Breaking off (abruption) of the placenta from the uterus (rare).  Rupture of the uterus (very rare). When induction is needed for medical reasons, the benefits of induction generally outweigh the risks. What are some reasons for not inducing labor? Labor induction should not be done if:  Your baby does not tolerate contractions.  You have had previous surgeries on your uterus, such as a myomectomy, removal of fibroids, or a vertical scar from a previous cesarean delivery.  Your placenta lies very low in your uterus and blocks the opening of the cervix (placenta previa).  Your baby is not in a head-down position.  The umbilical cord drops down into the birth canal in front of the baby.  There are unusual circumstances, such as the baby being very early (premature).  You have had more than 2 previous cesarean deliveries. Summary  Labor induction is when steps are taken to cause a pregnant woman to begin the labor process.  Labor induction causes a pregnant woman's uterus to contract. It also causes the cervix to ripen, dilate, and efface.  Labor is not induced before 39 weeks of pregnancy unless there is a medical reason to do so.  When induction is needed for medical reasons, the benefits of induction generally outweigh the risks. This information is not intended  to replace advice given to you by your health care provider. Make sure you discuss any questions you have with your health care provider. Document Released: 07/17/2006 Document Revised: 04/10/2016 Document Reviewed: 04/10/2016 Elsevier Interactive Patient Education  2019 [employer] Inc.                  WE WOULD LOVE TO HEAR FROM YOU!!!!   Thank you Reginold AgentAllison [place] for visiting [facility].  Providing our patients with the best experience possible is really important to us, and we hope that you felt that on your recent visit. The most valuable feedback we get comes from YOU!!    If you receive a survey please take a couple of minutes to let us know how we did.Thank you for continuing to trust us with your care.   [facility]  [facility]   [facility]  [address], [place], [place] [address]  Phone: [phone]  [facility] (second location)  [address], [place], [place] [address]  Phone: [phone]   [facility]) [address] [place], [place], [place] [address] Phone: [phone]   [facility]  [address], [place], [place] [address]  Phone: [phone]

## 2018-05-25 NOTE — Progress Notes (Signed)
ROB-NST performed today was reviewed and was found to be reactive. Baseline 125 bpm with moderate variability, accelerations present and no decelerations noted; irregular contractions present. Herbal prep handout, local pediatrician list, and [facility] brochure given. Anticipatory guidance regarding course of prenatal care. Encouraged twice daily kick counts. Reviewed red flag symptoms and when to call. RTC x 1 week for NST, foley bulb placement and ROB or sooner if needed.

## 2018-05-29 ENCOUNTER — Other Ambulatory Visit: Payer: BC Managed Care – PPO

## 2018-05-29 ENCOUNTER — Telehealth: Payer: Self-pay | Admitting: Certified Nurse Midwife

## 2018-05-29 ENCOUNTER — Ambulatory Visit (INDEPENDENT_AMBULATORY_CARE_PROVIDER_SITE_OTHER): Payer: BC Managed Care – PPO | Admitting: Certified Nurse Midwife

## 2018-05-29 ENCOUNTER — Inpatient Hospital Stay
Admission: EM | Admit: 2018-05-29 | Discharge: 2018-05-31 | DRG: 807 | Disposition: A | Discharge status: Home / Self Care | Payer: BC Managed Care – PPO | Attending: Certified Nurse Midwife | Admitting: Certified Nurse Midwife

## 2018-05-29 ENCOUNTER — Other Ambulatory Visit: Payer: Self-pay

## 2018-05-29 VITALS — BP 106/78 | HR 97 | Wt 237.0 lb

## 2018-05-29 DIAGNOSIS — E669 Obesity, unspecified: Secondary | ICD-10-CM | POA: Diagnosis present

## 2018-05-29 DIAGNOSIS — O4703 False labor before 37 completed weeks of gestation, third trimester: Secondary | ICD-10-CM | POA: Diagnosis not present

## 2018-05-29 DIAGNOSIS — O99214 Obesity complicating childbirth: Secondary | ICD-10-CM | POA: Diagnosis present

## 2018-05-29 DIAGNOSIS — O24420 Gestational diabetes mellitus in childbirth, diet controlled: Secondary | ICD-10-CM | POA: Diagnosis present

## 2018-05-29 DIAGNOSIS — Z3493 Encounter for supervision of normal pregnancy, unspecified, third trimester: Secondary | ICD-10-CM

## 2018-05-29 DIAGNOSIS — O9902 Anemia complicating childbirth: Secondary | ICD-10-CM | POA: Diagnosis present

## 2018-05-29 DIAGNOSIS — Z3A39 39 weeks gestation of pregnancy: Secondary | ICD-10-CM

## 2018-05-29 DIAGNOSIS — D649 Anemia, unspecified: Secondary | ICD-10-CM | POA: Diagnosis present

## 2018-05-29 DIAGNOSIS — O26893 Other specified pregnancy related conditions, third trimester: Secondary | ICD-10-CM | POA: Diagnosis present

## 2018-05-29 DIAGNOSIS — O368930 Maternal care for other specified fetal problems, third trimester, not applicable or unspecified: Secondary | ICD-10-CM | POA: Diagnosis not present

## 2018-05-29 DIAGNOSIS — Z3403 Encounter for supervision of normal first pregnancy, third trimester: Secondary | ICD-10-CM

## 2018-05-29 LAB — TYPE AND SCREEN
ABO/RH(D): A POS
Antibody Screen: NEGATIVE

## 2018-05-29 LAB — GLUCOSE, CAPILLARY
Glucose-Capillary: 112 mg/dL — ABNORMAL HIGH (ref 70–99)
Glucose-Capillary: 122 mg/dL — ABNORMAL HIGH (ref 70–99)

## 2018-05-29 LAB — POCT URINALYSIS DIPSTICK OB
Bilirubin, UA: NEGATIVE
Blood, UA: NEGATIVE
Glucose, UA: NEGATIVE
Ketones, UA: NEGATIVE
Leukocytes, UA: NEGATIVE
Nitrite, UA: NEGATIVE
PROTEIN: NEGATIVE
Spec Grav, UA: 1.02 (ref 1.010–1.025)
Urobilinogen, UA: 0.2 E.U./dL
pH, UA: 6 (ref 5.0–8.0)

## 2018-05-29 LAB — CBC
HCT: 34.3 % — ABNORMAL LOW (ref 36.0–46.0)
Hemoglobin: 11.5 g/dL — ABNORMAL LOW (ref 12.0–15.0)
MCH: 30.7 pg (ref 26.0–34.0)
MCHC: 33.5 g/dL (ref 30.0–36.0)
MCV: 91.7 fL (ref 80.0–100.0)
Platelets: 232 10*3/uL (ref 150–400)
RBC: 3.74 MIL/uL — ABNORMAL LOW (ref 3.87–5.11)
RDW: 14.5 % (ref 11.5–15.5)
WBC: 12.5 10*3/uL — ABNORMAL HIGH (ref 4.0–10.5)
nRBC: 0 % (ref 0.0–0.2)

## 2018-05-29 MED ORDER — OXYTOCIN 10 UNIT/ML IJ SOLN
INTRAMUSCULAR | Status: AC
  Filled 2018-05-29: qty 2

## 2018-05-29 MED ORDER — OXYCODONE-ACETAMINOPHEN 5-325 MG PO TABS: 2.0000 | ORAL_TABLET | ORAL | Status: DC | PRN

## 2018-05-29 MED ORDER — BUTORPHANOL TARTRATE 1 MG/ML IJ SOLN
1.0000 mg | INTRAMUSCULAR | Status: DC | PRN
  Administered 2018-05-29: 1 mg via INTRAVENOUS
  Filled 2018-05-29: qty 1

## 2018-05-29 MED ORDER — SOD CITRATE-CITRIC ACID 500-334 MG/5ML PO SOLN: 30.0000 mL | ORAL | Status: DC | PRN

## 2018-05-29 MED ORDER — LACTATED RINGERS IV SOLN: 500.0000 mL | INTRAVENOUS | Status: DC | PRN

## 2018-05-29 MED ORDER — LIDOCAINE HCL (PF) 1 % IJ SOLN
INTRAMUSCULAR | Status: AC
  Administered 2018-05-29: 30 mL
  Filled 2018-05-29: qty 30

## 2018-05-29 MED ORDER — ACETAMINOPHEN 325 MG PO TABS: 650.0000 mg | ORAL_TABLET | ORAL | Status: DC | PRN

## 2018-05-29 MED ORDER — OXYTOCIN BOLUS FROM INFUSION
500.0000 mL | Freq: Once | INTRAVENOUS | Status: AC
  Administered 2018-05-29: 500 mL via INTRAVENOUS

## 2018-05-29 MED ORDER — OXYTOCIN 40 UNITS IN NORMAL SALINE INFUSION - SIMPLE MED
1.0000 m[IU]/min | INTRAVENOUS | Status: DC
  Administered 2018-05-29: 2 m[IU]/min via INTRAVENOUS
  Filled 2018-05-29: qty 1000

## 2018-05-29 MED ORDER — ONDANSETRON HCL 4 MG/2ML IJ SOLN: 4.0000 mg | Freq: Four times a day (QID) | INTRAMUSCULAR | Status: DC | PRN

## 2018-05-29 MED ORDER — MISOPROSTOL 200 MCG PO TABS
ORAL_TABLET | ORAL | Status: AC
  Filled 2018-05-29: qty 4

## 2018-05-29 MED ORDER — ZOLPIDEM TARTRATE 5 MG PO TABS: 5.0000 mg | ORAL_TABLET | Freq: Every evening | ORAL | Status: DC | PRN

## 2018-05-29 MED ORDER — LACTATED RINGERS IV SOLN
INTRAVENOUS | Status: DC
  Administered 2018-05-29: 19:00:00 via INTRAVENOUS

## 2018-05-29 MED ORDER — LIDOCAINE HCL (PF) 1 % IJ SOLN: 30.0000 mL | INTRAMUSCULAR | Status: DC | PRN

## 2018-05-29 MED ORDER — OXYTOCIN 40 UNITS IN NORMAL SALINE INFUSION - SIMPLE MED: 2.5000 [IU]/h | INTRAVENOUS | Status: DC

## 2018-05-29 MED ORDER — AMMONIA AROMATIC IN INHA
RESPIRATORY_TRACT | Status: AC
  Filled 2018-05-29: qty 10

## 2018-05-29 MED ORDER — TERBUTALINE SULFATE 1 MG/ML IJ SOLN: 0.2500 mg | Freq: Once | INTRAMUSCULAR | Status: DC | PRN

## 2018-05-29 MED ORDER — OXYCODONE-ACETAMINOPHEN 5-325 MG PO TABS: 1.0000 | ORAL_TABLET | ORAL | Status: DC | PRN

## 2018-05-29 NOTE — Patient Instructions (Signed)

## 2018-05-29 NOTE — Telephone Encounter (Signed)
The patient called and stated that the "baloon is not coming out and is experiencing bleeding and steady painful contractions" Patient needed to speak with nurse or provider asap, I was not able to reach anyone. Pt and spouse are heading to emergency room. Please advise.

## 2018-05-29 NOTE — H&P (Signed)
Obstetric History and Physical  Crystal Shepard is a 23 y.o. G1P0 with IUP at [redacted]w[redacted]d presenting with contractions and vaginal bleeding after foley bulb insertion in office early today.   Patient states she has been having  regular, every five (5) to seven (7) minutes contractions, minimal vaginal bleeding, intact membranes, with active fetal movement.    Denies difficulty breathing or respiratory distress, chest pain, abdominal pain, dysuria, and leg pain or swelling.   Prenatal Course  Source of Care: [facility]-initial visit 14 weeks, total visits: 11  Pregnancy complications or risks: Obesity in pregnancy, Single umbilical artery (two vessel cord), Gestational diabetes-diet controlled, Anemia in pregnancy  Prenatal labs and studies:  ABO, Rh: A/Positive/-- 2022/12/25 1121)  Antibody: Negative 2022/12/25 1121)  Rubella: 5.56 12-25-2022 1121)  Varicella: 259 12-25-2022 1121)  RPR: Non Reactive (01/06 0956)   HBsAg: Negative 12/25/22 1121)   HIV: Non Reactive 2022/12/25 1121)   ATF:TDDUKGUR (02/26 1143)  1 hr Glucola: 130 (10/21 0917)  1 hr Glucola: 166 (01/06 0956)  Glucose Tolerance Test: 90, 191, 166, 125 (01/22 4270)  Genetic screening: Low risk female (10/03 0920)  Anatomy US: Complete and normal expect two (2) vessel cord (11/06 1411)  Past Medical History:  Diagnosis Date  . Anemia     Past Surgical History:  Procedure Laterality Date  . NO PAST SURGERIES      OB History  Gravida Para Term Preterm AB Living  1            SAB TAB Ectopic Multiple Live Births               # Outcome Date GA Lbr Len/2nd Weight Sex Delivery Anes PTL Lv  1 Current             Social History   Socioeconomic History  . Marital status: Single    Spouse name: Not on file  . Number of children: Not on file  . Years of education: Not on file  . Highest education level: Not on file  Occupational History  . Not on file  Social Needs  . Financial resource strain: Not hard at all  . Food  insecurity:    Worry: Never true    Inability: Never true  . Transportation needs:    Medical: No    Non-medical: No  Tobacco Use  . Smoking status: Never Smoker  . Smokeless tobacco: Never Used  Substance and Sexual Activity  . Alcohol use: Not Currently  . Drug use: Never  . Sexual activity: Yes    Birth control/protection: Pill  Lifestyle  . Physical activity:    Days per week: 2 days    Minutes per session: 30 min  . Stress: Not at all  Relationships  . Social connections:    Talks on phone: More than three times a week    Gets together: More than three times a week    Attends religious service: 1 to 4 times per year    Active member of club or organization: No    Attends meetings of clubs or organizations: Never    Relationship status: Living with partner  Other Topics Concern  . Not on file  Social History Narrative  . Not on file    History reviewed. No pertinent family history.  Medications Prior to Admission  Medication Sig Dispense Refill Last Dose  . FLUoxetine (PROZAC) 10 MG capsule Take 1 capsule (10 mg total) by mouth daily. 30 capsule 6 05/28/2018 at Unknown  time  . glucose blood test strip Use as instructed 100 each 12 05/28/2018 at Unknown time  . IRON PO Take 1 tablet by mouth every other day.   05/28/2018 at Unknown time  . Lancets (ONETOUCH ULTRASOFT) lancets Use as instructed 100 each 12 05/28/2018 at Unknown time  . Prenatal Vit-Fe Fumarate-FA (PRENATAL MULTIVITAMIN) TABS tablet Take 1 tablet by mouth daily at 12 noon.   05/28/2018 at Unknown time    No Known Allergies  Review of Systems: Negative except for what is mentioned in HPI.  Physical Exam:  Temp:  [98.1 F (36.7 C)] 98.1 F (36.7 C) (03/20 1757) Pulse Rate:  [90-97] 91 (03/20 1800) Resp:  [14] 14 (03/20 1757) BP: (106-130)/(75-80) 127/75 (03/20 1800) Weight:  [107.5 kg] 107.5 kg (03/20 1757)  GENERAL: Well-developed, well-nourished female in no acute distress.   LUNGS: Clear to  auscultation bilaterally.   HEART: Regular rate and rhythm.  ABDOMEN: Soft, nontender, nondistended, gravid.  EXTREMITIES: Nontender, no edema, 2+ distal pulses.  Cervical Exam: Dilation: 5.5 Effacement (%): 70 Station: -2 Exam by:: Marcelino Duster CNM  FHT:  Baseline rate 130 bpm   Variability moderate  Accelerations present   Decelerations none  Contractions: Every two (2) to six (6) minutes; soft resting tone   Pertinent Labs/Studies:   Results for orders placed or performed during the hospital encounter of 05/29/18 (from the past 24 hour(s))  CBC     Status: Abnormal   Collection Time: 05/29/18  3:09 PM  Result Value Ref Range   WBC 12.5 (H) 4.0 - 10.5 K/uL   RBC 3.74 (L) 3.87 - 5.11 MIL/uL   Hemoglobin 11.5 (L) 12.0 - 15.0 g/dL   HCT 40.7 (L) 68.0 - 88.1 %   MCV 91.7 80.0 - 100.0 fL   MCH 30.7 26.0 - 34.0 pg   MCHC 33.5 30.0 - 36.0 g/dL   RDW 10.3 15.9 - 45.8 %   Platelets 232 150 - 400 K/uL   nRBC 0.0 0.0 - 0.2 %    Assessment :  Crystal Shepard is a 23 y.o. G1P0 at [redacted]w[redacted]d being admitted for labor, Rh positive, GBS negative, Obesity in pregnancy, Single umbilical artery (two vessel cord), Gestational diabetes-diet controlled, Anemia in pregnancy  FHR Category I  Plan:  Admit to birthing suites, see orders.   Labor: Expectant management.  Induction/Augmentation as needed, per protocol.  Delivery plan: Hopeful for vaginal delivery.   Dr Valentino Saxon notified of admission and plan of care.    Gunnar Bulla, CNM [facility], [facility] 05/29/18 6:11 PM

## 2018-05-29 NOTE — Progress Notes (Signed)
Patient ID: Crystal Shepard, female   DOB: 08-Apr-1995, 23 y.o.   MRN: [id number]  Crystal Shepard is a 23 y.o. G1P0 at [redacted]w[redacted]d by LMP admitted for active labor  Subjective:  [name], watching television. Reports regular contractions. Pain: 5/10.   Denies difficulty breathing or respiratory distress, chest pain, dysuria, and leg pain or swelling.   FOB at bedside for continuous labor support.   Objective:  Temp:  [98.1 F (36.7 C)-98.2 F (36.8 C)] 98.2 F (36.8 C) (03/20 1927) Pulse Rate:  [90-97] 93 (03/20 1927) Resp:  [14] 14 (03/20 1927) BP: (106-145)/(75-89) 134/84 (03/20 1927) Weight:  [107.5 kg] 107.5 kg (03/20 1757)  Fetal Wellbeing:  Category I  UC:   regular, every three (3) to six (6) minutes; soft resting tone; pitocin infusing at 6 mu/min  SVE:   Dilation: 5.5 Effacement (%): 70 Station: -2 Exam by:: Micelle CNM  Labs: Lab Results  Component Value Date   WBC 12.5 (H) 05/29/2018   HGB 11.5 (L) 05/29/2018   HCT 34.3 (L) 05/29/2018   MCV 91.7 05/29/2018   PLT 232 05/29/2018    Assessment:  Crystal Shepard a 23 y.o.G1P0 at [redacted]w[redacted]d admitted for labor, Rh positive, GBS negative, Obesity in pregnancy, Single umbilical artery (two vessel cord), Gestational diabetes-diet controlled, Anemia in pregnancy, AROM-meconium fluid  FHR Category I  Plan:  Pitocin increased to 6 mu/min, see MAR.   Encouraged position change and ambulation.   Patient repositioned on right side with left leg over small peanut ball.   Reviewed red flag symptoms and when to call.   Continue orders as written. Reassess as needed.    Gunnar Bulla, CNM [facility], [facility] 05/29/2018, 8:38 PM

## 2018-05-29 NOTE — OB Triage Note (Signed)
Patient here for vaginal bleeding after foley bulb insertion in the office today, some contractions. Denies LOF

## 2018-05-29 NOTE — Progress Notes (Signed)
FOLEY BULB PROCEDURE NOTE:    I have verified that the patient is an appropriate candidate for outpatient foley bulb placement.  She has consented to foley bulb placement today. She has no concerns at this time. A pre-procedure NST performed today was reviewed and was found to be reactive.     The patient was placed in the dorsal lithotomy position.  A speculum was inserted into the vagina, the cervix was visualized and grasped with a single tooth tenaculum. The cervix was noted to be 1 cm dilated  A foley catheter was advanced into the cervix slightly pass the internal cervical os.  The foley balloon was filled with 30 cc of normal saline.  Gentle traction was placed on the foley bulb, and the catheter was taped to the medial portion of the thigh.  The patient tolerated the procedure well.  A post-procedure NST was performed. Sterile technique was observed throughout.      NONSTRESS TEST INTERPRETATION  INDICATIONS: Foley bulb induction placement  FHR baseline: 135 bpm (pre-procedure) and 120  bpm (post procedure) RESULTS:Reactive x 2.  COMMENTS: Irregular contractions present  PLAN: 1. To arrive at Labor and Delivery for scheduled induction of labor on Saturday, 05/30/2018 at 0500.  She was given post-procedure instructions.     Gunnar Bulla, CNM [facility], [facility] 06/01/18 4:59 PM

## 2018-05-29 NOTE — Progress Notes (Signed)
Patient ID: Crystal Shepard, female   DOB: 16-Dec-1995, 23 y.o.   MRN: [id number]  Crystal Shepard is a 23 y.o. G1P0 at [redacted]w[redacted]d by LMP admitted for active labor  Subjective:  [name], no questions or concerns. FOB at bedside for continuous labor support.   Denies difficulty breathing or respiratory distress, chest pain, abdominal pain, dysuria, and leg pain or swelling.   Objective:  Temp:  [98.1 F (36.7 C)] 98.1 F (36.7 C) (03/20 1757) Pulse Rate:  [90-97] 96 (03/20 1843) Resp:  [14] 14 (03/20 1757) BP: (106-145)/(75-89) 145/89 (03/20 1843) Weight:  [107.5 kg] 107.5 kg (03/20 1757)  Fetal Wellbeing:  Category I  UC:   regular, every three (3) to four (4) minutes; soft resting tone; pitocin infusing at 2 mu/min  SVE:   Dilation: 5.5 Effacement (%): 70 Station: -2 Exam by:: Micelle CNM AROM moderate amount of particulate meconium fluid  Labs: Lab Results  Component Value Date   WBC 12.5 (H) 05/29/2018   HGB 11.5 (L) 05/29/2018   HCT 34.3 (L) 05/29/2018   MCV 91.7 05/29/2018   PLT 232 05/29/2018    Assessment:  Crystal Shepard is a 23 y.o. G1P0 at [redacted]w[redacted]d being admitted for labor, Rh positive, GBS negative, Obesity in pregnancy, Single umbilical artery (two vessel cord), Gestational diabetes-diet controlled, Anemia in pregnancy, AROM-meconium fluid  FHR Category I  Plan:  Discussed nonpharmacologic and pharmacologic pain management techniques.   Encouraged position change and use of peanut ball.   Receiving nurse notified due to presence of meconium.   Reviewed red flag symptoms and when to call.   Continue orders as written. Reassess as needed.   Update given to Dr. Valentino Saxon.    Gunnar Bulla, CNM [facility], [facility] 05/29/2018, 7:05 PM

## 2018-05-30 DIAGNOSIS — Z3A39 39 weeks gestation of pregnancy: Secondary | ICD-10-CM

## 2018-05-30 DIAGNOSIS — O9902 Anemia complicating childbirth: Secondary | ICD-10-CM

## 2018-05-30 DIAGNOSIS — O368930 Maternal care for other specified fetal problems, third trimester, not applicable or unspecified: Secondary | ICD-10-CM

## 2018-05-30 DIAGNOSIS — O24420 Gestational diabetes mellitus in childbirth, diet controlled: Principal | ICD-10-CM

## 2018-05-30 LAB — COMPREHENSIVE METABOLIC PANEL
ALK PHOS: 132 U/L — AB (ref 38–126)
ALT: 19 U/L (ref 0–44)
AST: 29 U/L (ref 15–41)
Albumin: 2.5 g/dL — ABNORMAL LOW (ref 3.5–5.0)
Anion gap: 9 (ref 5–15)
BUN: 10 mg/dL (ref 6–20)
CO2: 21 mmol/L — ABNORMAL LOW (ref 22–32)
Calcium: 8.7 mg/dL — ABNORMAL LOW (ref 8.9–10.3)
Chloride: 108 mmol/L (ref 98–111)
Creatinine, Ser: 0.67 mg/dL (ref 0.44–1.00)
GFR calc non Af Amer: >60 mL/min (ref >60)
Glucose, Bld: 124 mg/dL — ABNORMAL HIGH (ref 70–99)
Potassium: 3.6 mmol/L (ref 3.5–5.1)
Sodium: 138 mmol/L (ref 135–145)
Total Bilirubin: 0.6 mg/dL (ref 0.3–1.2)
Total Protein: 6.1 g/dL — ABNORMAL LOW (ref 6.5–8.1)

## 2018-05-30 LAB — CBC
HCT: 32.1 % — ABNORMAL LOW (ref 36.0–46.0)
Hemoglobin: 10.6 g/dL — ABNORMAL LOW (ref 12.0–15.0)
MCH: 30.5 pg (ref 26.0–34.0)
MCHC: 33 g/dL (ref 30.0–36.0)
MCV: 92.2 fL (ref 80.0–100.0)
PLATELETS: 209 10*3/uL (ref 150–400)
RBC: 3.48 MIL/uL — ABNORMAL LOW (ref 3.87–5.11)
RDW: 14.4 % (ref 11.5–15.5)
WBC: 15.4 10*3/uL — ABNORMAL HIGH (ref 4.0–10.5)
nRBC: 0 % (ref 0.0–0.2)

## 2018-05-30 MED ORDER — IBUPROFEN 600 MG PO TABS
ORAL_TABLET | ORAL | Status: AC
  Administered 2018-05-30: 03:00:00
  Filled 2018-05-30: qty 1

## 2018-05-30 MED ORDER — PRENATAL MULTIVITAMIN CH
1.0000 | ORAL_TABLET | Freq: Every day | ORAL | Status: DC
  Administered 2018-05-30 – 2018-05-31 (×2): 1 via ORAL
  Filled 2018-05-30 (×2): qty 1

## 2018-05-30 MED ORDER — IBUPROFEN 600 MG PO TABS: 600.0000 mg | ORAL_TABLET | Freq: Four times a day (QID) | ORAL | 0 refills | Status: DC

## 2018-05-30 MED ORDER — COCONUT OIL OIL
1.0000 "application " | TOPICAL_OIL | Status: DC | PRN
  Administered 2018-05-30: 1 via TOPICAL
  Filled 2018-05-30: qty 120

## 2018-05-30 MED ORDER — BENZOCAINE-MENTHOL 20-0.5 % EX AERO: 1.0000 "application " | INHALATION_SPRAY | CUTANEOUS | Status: DC | PRN

## 2018-05-30 MED ORDER — DIPHENHYDRAMINE HCL 25 MG PO CAPS: 25.0000 mg | ORAL_CAPSULE | Freq: Four times a day (QID) | ORAL | Status: DC | PRN

## 2018-05-30 MED ORDER — DIBUCAINE 1 % RE OINT: 1.0000 "application " | TOPICAL_OINTMENT | RECTAL | Status: DC | PRN

## 2018-05-30 MED ORDER — ONDANSETRON HCL 4 MG PO TABS: 4.0000 mg | ORAL_TABLET | ORAL | Status: DC | PRN

## 2018-05-30 MED ORDER — SODIUM CHLORIDE 0.9% FLUSH: 3.0000 mL | INTRAVENOUS | Status: DC | PRN

## 2018-05-30 MED ORDER — ACETAMINOPHEN 325 MG PO TABS: 650.0000 mg | ORAL_TABLET | ORAL | Status: DC | PRN

## 2018-05-30 MED ORDER — FLUOXETINE HCL 10 MG PO CAPS
10.0000 mg | ORAL_CAPSULE | Freq: Every day | ORAL | Status: DC
  Administered 2018-05-30 – 2018-05-31 (×2): 10 mg via ORAL
  Filled 2018-05-30 (×2): qty 1

## 2018-05-30 MED ORDER — ONDANSETRON HCL 4 MG/2ML IJ SOLN: 4.0000 mg | INTRAMUSCULAR | Status: DC | PRN

## 2018-05-30 MED ORDER — SODIUM CHLORIDE 0.9% FLUSH: 3.0000 mL | Freq: Two times a day (BID) | INTRAVENOUS | Status: DC

## 2018-05-30 MED ORDER — WITCH HAZEL-GLYCERIN EX PADS: 1.0000 "application " | MEDICATED_PAD | CUTANEOUS | Status: DC | PRN

## 2018-05-30 MED ORDER — SIMETHICONE 80 MG PO CHEW: 80.0000 mg | CHEWABLE_TABLET | ORAL | Status: DC | PRN

## 2018-05-30 MED ORDER — SODIUM CHLORIDE 0.9 % IV SOLN: 250.0000 mL | INTRAVENOUS | Status: DC | PRN

## 2018-05-30 MED ORDER — IBUPROFEN 600 MG PO TABS
600.0000 mg | ORAL_TABLET | Freq: Four times a day (QID) | ORAL | Status: DC
  Administered 2018-05-30 – 2018-05-31 (×6): 600 mg via ORAL
  Filled 2018-05-30 (×6): qty 1

## 2018-05-30 MED ORDER — SENNOSIDES-DOCUSATE SODIUM 8.6-50 MG PO TABS
2.0000 | ORAL_TABLET | ORAL | Status: DC
  Administered 2018-05-31: 2 via ORAL
  Filled 2018-05-30: qty 2

## 2018-05-30 NOTE — Progress Notes (Signed)
Patient ID: Crystal Shepard, female   DOB: February 20, 1996, 23 y.o.   MRN: [id number]  Post Partum Day # 1, s/p spontaneous vaginal birth, Rh positive, Gestational diabetes-diet controlled, Anemia  Subjective:  Patient doing well, sitting in bed holding infant. FOB at bedside for support. Questions answered.   Denies difficulty breathing or respiratory distress, chest pain, abdominal pain, excessive vaginal bleeding, dysuria, and leg pain or swelling.   Objective:  Temp:  [98 F (36.7 C)-99.3 F (37.4 C)] 98.6 F (37 C) (03/21 1708) Pulse Rate:  [74-104] 86 (03/21 1708) Resp:  [14-18] 18 (03/21 1708) BP: (103-145)/(57-89) 123/70 (03/21 1708) SpO2:  [96 %-97 %] 97 % (03/21 0436) Weight:  [107.5 kg] 107.5 kg (03/20 1757)  Physical Exam:   General: alert and cooperative   Lungs: clear to auscultation bilaterally  Breasts: normal appearance, no masses or tenderness; flat nipples  Heart: normal apical impulse  Abdomen: soft, non-tender; bowel sounds normal; no masses,  no organomegaly  Pelvis: Lochia: appropriate, Uterine Fundus: firm  Extremities: DVT Evaluation: No evidence of DVT seen on physical exam.  Recent Labs    05/29/18 1509 05/30/18 0910  HGB 11.5* 10.6*  HCT 34.3* 32.1*    Assessment:  23 year old female, G1P1Post Partum Day # 1, s/p spontaneous vaginal birth, Rh positive, Gestational diabetes-diet controlled, Anemia   Breastfeeding  Plan:  Routine postpartum education and care.   Reviewed red flag symptoms and when to call.   Discharge in morning, see orders.    LOS: 1 day    Gunnar Bulla, CNM [facility], [facility] 05/30/2018 5:36 PM

## 2018-05-30 NOTE — Lactation Note (Signed)
This note was copied from a baby's chart. Lactation Consultation Note  Patient Name: Boy Shandrea Ahlman DXAJO'I Date: 05/30/2018 Reason for consult: Follow-up assessment;Mother's request;Difficult latch;Primapara;Term;Other (Comment)(Left nipple flat & right nipple slightly inverted) [name] was demonstrating feeding cues.  Attempted to latch without success.  Hand expressed and pre pumped right breast.  Left nipple is semi flat, but right nipple is slightly inverted.  Got a couple of drops of colostrum.  Attempted again to get him to latch without success. Mom had breast fed on left breast at last feeding with FOB getting Grayson to latch without nipple shield, so was trying first without nipple shield on right breast.  Right nipple just keeps slightly inverted when compressed and Grayson cannot not latch deeply enough to sustain latch.  Once nipple shield was placed, we could got him to latch and take a few strong tugs on the breast that mom could definitely feel.  He kept falling asleep, requiring frequent stimulation and breast massage to get him to keep taking a few more sucks at the breast.  When he refused to continue to suck anymore, left him at the breast skin to skin for a few more minutes but never got any more strong tugs.  Mom has been given shells for inverted nipples and instructed in use, but mom has not put on bra yet so is not wearing shells.  Hand expression and pumping has helped to evert left nipple more, but right nipple just keeps inverting even after pumping.  Demonstrated hand expression of colostrum after breast feed and encouraged to rub on nipples to prevent bacteria growth, lubrication and comfort.  Coconut oil given and instructed in use.  Reviewed supply and demand, normal course of lactation and routine newborn feeding patterns.  Lactation name and number written on white board and encouraged to call with any questions, concerns or assistance.      Maternal Data Formula  Feeding for Exclusion: No Has patient been taught Hand Expression?: Yes(Can hand express couple of drops of colostrum) Does the patient have breastfeeding experience prior to this delivery?: No(This is mom's first baby)  Feeding Feeding Type: Breast Fed  LATCH Score Latch: Repeated attempts needed to sustain latch, nipple held in mouth throughout feeding, stimulation needed to elicit sucking reflex.  Audible Swallowing: None  Type of Nipple: Inverted  Comfort (Breast/Nipple): Soft / non-tender  Hold (Positioning): Assistance needed to correctly position infant at breast and maintain latch.  LATCH Score: 4  Interventions Interventions: Assisted with latch;Skin to skin;Breast massage;Hand express;Pre-pump if needed;Reverse pressure;Breast compression;Adjust position;Support pillows;Position options;Expressed milk;DEBP  Lactation Tools Discussed/Used Tools: Pump;Nipple Shields Nipple shield size: 20 Shell Type: Inverted Breast pump type: Double-Electric Breast Pump(Pre pumped to help evert right nipple) WIC Program: Yes(Has maternity BCBS also) Pump Review: Setup, frequency, and cleaning;Milk Storage;Other (comment) Initiated by:: S.Cathryne Mancebo,RN,BSN,IBCLC Date initiated:: 05/30/18   Consult Status Consult Status: Follow-up Date: 05/30/18 Follow-up type: Call as needed    Louis Meckel 05/30/2018, 5:10 PM

## 2018-05-30 NOTE — Discharge Summary (Signed)
  Obstetric Discharge Summary  Patient ID: Crystal Shepard MRN: [id number] DOB/AGE: 09/10/1995 23 y.o.   Date of Admission: 05/29/2018  Date of Discharge: 05/31/2018  Admitting Diagnosis: Onset of Labor at [redacted]w[redacted]d  Secondary Diagnosis: Obesity in pregnancy, Single umbilical artery (two vessel cord), Gestational diabetes-diet controlled, Anemia in pregnancy  Mode of Delivery: Normal spontaneous vaginal delivery     Discharge Diagnosis: No other diagnosis   Intrapartum Procedures: Atificial rupture of membranes and pitocin augmentation   Post partum procedures: None  Complications: Second degree perineal laceration and vaginal laceration, repaired   Brief Hospital Course   Crystal Shepard is a G1P1001 who had a SVD on 05/29/2018;  for further details of this birth, please refer to the delivey note.  Patient had an uncomplicated postpartum course.  By time of discharge on PPD#2, her pain was controlled on oral pain medications; she had appropriate lochia and was ambulating, voiding without difficulty and tolerating regular diet.  She was deemed stable for discharge to home.    Labs: CBC Latest Ref Rng & Units 05/30/2018 05/29/2018 03/16/2018  WBC 4.0 - 10.5 K/uL 15.4(H) 12.5(H) 9.9  Hemoglobin 12.0 - 15.0 g/dL 10.6(L) 11.5(L) 9.8(L)  Hematocrit 36.0 - 46.0 % 32.1(L) 34.3(L) 29.2(L)  Platelets 150 - 400 K/uL 209 232 254   A POS  Physical exam:   Temp:  [98 F (36.7 C)-99.3 F (37.4 C)] 98.6 F (37 C) (03/21 1708) Pulse Rate:  [74-104] 86 (03/21 1708) Resp:  [14-18] 18 (03/21 1708) BP: (103-145)/(57-89) 123/70 (03/21 1708) SpO2:  [96 %-97 %] 97 % (03/21 0436) Weight:  [107.5 kg] 107.5 kg (03/20 1757)  General: alert and no distress  Lochia: appropriate  Abdomen: soft, NT  Uterine Fundus: firm  Lacerations: healing well, no significant drainage, no dehiscence, no significant erythema  Extremities: No evidence of DVT seen on physical exam. No lower extremity  edema.  Discharge Instructions: Per After Visit Summary.  Activity: Advance as tolerated. Pelvic rest for 6 weeks.  Also refer to After Visit  Summary  Diet: Regular  Medications: Allergies as of 05/30/2018   No Known Allergies     Medication List    TAKE these medications   FLUoxetine 10 MG capsule Commonly known as:  PROzac Take 1 capsule (10 mg total) by mouth daily.   glucose blood test strip Use as instructed   ibuprofen 600 MG tablet Commonly known as:  ADVIL,MOTRIN Take 1 tablet (600 mg total) by mouth every 6 (six) hours.   IRON PO Take 1 tablet by mouth every other day.   onetouch ultrasoft lancets Use as instructed   prenatal multivitamin Tabs tablet Take 1 tablet by mouth daily at 12 noon.      Outpatient follow up:  Follow-up Information    Gunnar Bulla, CNM. Call.   Specialties:  Certified Nurse Midwife, Obstetrics and Gynecology, Radiology Why:  Please call to schedule six (6) week postpartum visit Contact information: [address] [place] [address] [phone]          Postpartum contraception: oral progesterone-only contraceptive  Discharged Condition: stable  Discharged to: home   Newborn Data:  Disposition:home with mother  Apgars: APGAR (1 MIN): 8   APGAR (5 MINS): 9    Baby Feeding: Breast   Gunnar Bulla, CNM [facility], [facility] 05/30/18 5:49 PM

## 2018-05-30 NOTE — Discharge Instructions (Signed)
Breast Pumping Tips There may be times when you cannot feed your baby from your breast, such as when you are at work or on a trip. Breast pumping allows you to remove milk from your breast in order to store for later use. There are three ways to pump. You can use:  Your hand to massage and squeeze your breast (hand expression).  A handheld manual pump.  An electric pump. When you first start to pump, you may not get much milk, but after a few days your breasts should start to make more. Pumping can help stimulate your milk supply after your baby is born. It can also help maintain your milk supply when you are away from your baby. When should I pump? You can start pumping soon after your baby is born. Here are some tips on when to pump:  When with your baby: ? Pump after breastfeeding. ? Pump from the free breast while you breastfeed.  When away from your baby: ? Pump every 2-3 hours for about 15 minutes. ? Pump both breasts at the same time if you can.  If your baby gets formula feeding, pump around the time your baby gets that feeding.  If you drank alcohol, wait 2 hours before pumping.  If you are having a procedure with anesthesia, talk to your health care provider about when you should pump before and after. How do I prepare to pump? Take steps to relax. This makes it easier to stimulate your let-down reflex, which is what makes breast milk flow. To help:  Smell one of your infant's blankets or an item of clothing.  Look at a picture or video of your infant.  Sit in a quiet, private space.  Massage your breast and nipple.  Place a warm cloth on your breast. The cloth should be a little wet.  Play relaxing music.  Picture your milk flowing. What are some tips? General tips for pumping breast milk  Always wash your hands before pumping.  If you are not getting very much milk or pumping is uncomfortable, make adjustments to your pump or try using different type of  pumps.  Drink enough fluid to keep your urine clear or pale yellow.  Wear clothing that opens in the front or allows easy access to your breasts.  Pump breast milk directly into clean bottles or other storage containers.  Do not use any products that contain nicotine or tobacco, such as cigarettes and e-cigarettes. These can lower your milk supply and harm your infant. If you need help quitting, ask your health care provider. Tips for storing breast milk  Store breast milk in a clean, BPA-free container, such as glass or plastic bottles or milk storage bags.  Store breast milk in 2-4 ounce batches to reduce waste.  Swirl the breast milk in the container to mix any cream that floats to the top. Do not shake it.  Label all stored milk with the date you pumped it.  The amount of time you can keep breast milk depends on where it is stored: ? Room temperature: 6-8 hours, if the milk is clean. It is best if used within 4 hours. ? Cooler with ice packs: 24 hours. ? Refrigerator: 5-8 days, if the milk is clean. It is best if used within 3 days. ? Freezer: 9-12 months, if the milk is clean and stored away from the freezer door. It is best if used within 6 months.  When using a refrigerator or freezer,  put the milk in the back to keep it as cold as possible.  Thaw frozen milk using warm water. Do not use the microwave. Tips for choosing a breast pump The right pump for you will depend on your comfort and how often you will be away from your baby. When choosing a pump, consider the following:  Manual breast pumps do not need electricity to work. They are usually cheaper than electric pumps, but they can be harder to use. They may be a good choice if you are occasionally away from your baby.  Electric breast pumps are usually more expensive than manual pumps, but they can be easier for some women to use. They can also collect more milk than manual pumps. This makes them a good choice for women  who work in an office or need to be away from their baby for longer periods of time.  The suction cup (flange) should be the right size. If it is the wrong size, it may cause pain and nipple damage.  Before buying a pump, find out whether your insurance covers the cost of a breast pump. Tips for maintaining a breast pump  Check your pump's manual for cleaning tips.  Clean the pump after each use. To do this: ? Wipe down the electrical unit. Use a dry, soft cloth or clean paper towel. Do not put the electrical unit in water or cleaning products. ? Wash the plastic pump parts with soap and warm water or in the dishwasher, if the parts are dishwasher safe. You do not need to clean the tubing unless it comes in contact with breast milk. Let the parts air dry. Avoid drying them with a cloth or towel. ? When the pump parts are clean and dry, put the pump back together. Then store the pump.  If there is water in the tubing when it comes time to pump, attach the tubing to the pump and turn on the pump. Run the pump until the tube is dry.  Avoid touching the inside of pump parts that come in contact with breast milk. Summary  Pumping can help stimulate your milk supply after your baby is born. It can also help maintain your milk supply when you are away from your baby.  When you are away from your infant for several hours, pump for about 15 minutes every 2-3 hours. Pump both breasts at the same time, if you can.  Your health care provider or lactation consultant can help you decide which breast pump is right for you. The right pump for you depends on your comfort, work schedule, and how often you may be away from your baby. This information is not intended to replace advice given to you by your health care provider. Make sure you discuss any questions you have with your health care provider. Document Released: 08/15/2009 Document Revised: 11/14/2017 Document Reviewed: 04/01/2016 Elsevier Interactive  Patient Education  2019 [employer]   Breastfeeding  Choosing to breastfeed is one of the best decisions you can make for yourself and your baby. A change in hormones during pregnancy causes your breasts to make breast milk in your milk-producing glands. Hormones prevent breast milk from being released before your baby is born. They also prompt milk flow after birth. Once breastfeeding has begun, thoughts of your baby, as well as his or her sucking or crying, can stimulate the release of milk from your milk-producing glands. Benefits of breastfeeding Research shows that breastfeeding offers many health benefits for  infants and mothers. It also offers a cost-free and convenient way to feed your baby. For your baby  Your first milk (colostrum) helps your baby's digestive system to function better.  Special cells in your milk (antibodies) help your baby to fight off infections.  Breastfed babies are less likely to develop asthma, allergies, obesity, or type 2 diabetes. They are also at lower risk for sudden infant death syndrome (SIDS).  Nutrients in breast milk are better able to meet your babys needs compared to infant formula.  Breast milk improves your baby's brain development. For you  Breastfeeding helps to create a very special bond between you and your baby.  Breastfeeding is convenient. Breast milk costs nothing and is always available at the correct temperature.  Breastfeeding helps to burn calories. It helps you to lose the weight that you gained during pregnancy.  Breastfeeding makes your uterus return faster to its size before pregnancy. It also slows bleeding (lochia) after you give birth.  Breastfeeding helps to lower your risk of developing type 2 diabetes, osteoporosis, rheumatoid arthritis, cardiovascular disease, and breast, ovarian, uterine, and endometrial cancer later in life. Breastfeeding basics Starting breastfeeding  Find a comfortable place to sit or lie  down, with your neck and back well-supported.  Place a pillow or a rolled-up blanket under your baby to bring him or her to the level of your breast (if you are seated). Nursing pillows are specially designed to help support your arms and your baby while you breastfeed.  Make sure that your baby's tummy (abdomen) is facing your abdomen.  Gently massage your breast. With your fingertips, massage from the outer edges of your breast inward toward the nipple. This encourages milk flow. If your milk flows slowly, you may need to continue this action during the feeding.  Support your breast with 4 fingers underneath and your thumb above your nipple (make the letter "C" with your hand). Make sure your fingers are well away from your nipple and your babys mouth.  Stroke your baby's lips gently with your finger or nipple.  When your baby's mouth is open wide enough, quickly bring your baby to your breast, placing your entire nipple and as much of the areola as possible into your baby's mouth. The areola is the colored area around your nipple. ? More areola should be visible above your baby's upper lip than below the lower lip. ? Your baby's lips should be opened and extended outward (flanged) to ensure an adequate, comfortable latch. ? Your baby's tongue should be between his or her lower gum and your breast.  Make sure that your baby's mouth is correctly positioned around your nipple (latched). Your baby's lips should create a seal on your breast and be turned out (everted).  It is common for your baby to suck about 2-3 minutes in order to start the flow of breast milk. Latching Teaching your baby how to latch onto your breast properly is very important. An improper latch can cause nipple pain, decreased milk supply, and poor weight gain in your baby. Also, if your baby is not latched onto your nipple properly, he or she may swallow some air during feeding. This can make your baby fussy. Burping your  baby when you switch breasts during the feeding can help to get rid of the air. However, teaching your baby to latch on properly is still the best way to prevent fussiness from swallowing air while breastfeeding. Signs that your baby has successfully latched onto your  nipple °· Silent tugging or silent sucking, without causing you pain. Infant's lips should be extended outward (flanged). °· Swallowing heard between every 3-4 sucks once your milk has started to flow (after your let-down milk reflex occurs). °· Muscle movement above and in front of his or her ears while sucking. °Signs that your baby has not successfully latched onto your nipple °· Sucking sounds or smacking sounds from your baby while breastfeeding. °· Nipple pain. °If you think your baby has not latched on correctly, slip your finger into the corner of your baby’s mouth to break the suction and place it between your baby's gums. Attempt to start breastfeeding again. °Signs of successful breastfeeding °Signs from your baby °· Your baby will gradually decrease the number of sucks or will completely stop sucking. °· Your baby will fall asleep. °· Your baby's body will relax. °· Your baby will retain a small amount of milk in his or her mouth. °· Your baby will let go of your breast by himself or herself. °Signs from you °· Breasts that have increased in firmness, weight, and size 1-3 hours after feeding. °· Breasts that are softer immediately after breastfeeding. °· Increased milk volume, as well as a change in milk consistency and color by the fifth day of breastfeeding. °· Nipples that are not sore, cracked, or bleeding. °Signs that your baby is getting enough milk °· Wetting at least 1-2 diapers during the first 24 hours after birth. °· Wetting at least 5-6 diapers every 24 hours for the first week after birth. The urine should be clear or pale yellow by the age of 5 days. °· Wetting 6-8 diapers every 24 hours as your baby continues to grow and  develop. °· At least 3 stools in a 24-hour period by the age of 5 days. The stool should be soft and yellow. °· At least 3 stools in a 24-hour period by the age of 7 days. The stool should be seedy and yellow. °· No loss of weight greater than 10% of birth weight during the first 3 days of life. °· Average weight gain of 4-7 oz (113-198 g) per week after the age of 4 days. °· Consistent daily weight gain by the age of 5 days, without weight loss after the age of 2 weeks. °After a feeding, your baby may spit up a small amount of milk. This is normal. °Breastfeeding frequency and duration °Frequent feeding will help you make more milk and can prevent sore nipples and extremely full breasts (breast engorgement). Breastfeed when you feel the need to reduce the fullness of your breasts or when your baby shows signs of hunger. This is called "breastfeeding on demand." Signs that your baby is hungry include: °· Increased alertness, activity, or restlessness. °· Movement of the head from side to side. °· Opening of the mouth when the corner of the mouth or cheek is stroked (rooting). °· Increased sucking sounds, smacking lips, cooing, sighing, or squeaking. °· Hand-to-mouth movements and sucking on fingers or hands. °· Fussing or crying. °Avoid introducing a pacifier to your baby in the first 4-6 weeks after your baby is born. After this time, you may choose to use a pacifier. Research has shown that pacifier use during the first year of a baby's life decreases the risk of sudden infant death syndrome (SIDS). °Allow your baby to feed on each breast as long as he or she wants. When your baby unlatches or falls asleep while feeding from the first breast,   offer the second breast. Because newborns are often sleepy in the first few weeks of life, you may need to awaken your baby to get him or her to feed. Breastfeeding times will vary from baby to baby. However, the following rules can serve as a guide to help you make sure  that your baby is properly fed:  Newborns (babies 33 weeks of age or younger) may breastfeed every 1-3 hours.  Newborns should not go without breastfeeding for longer than 3 hours during the day or 5 hours during the night.  You should breastfeed your baby a minimum of 8 times in a 24-hour period. Breast milk pumping     Pumping and storing breast milk allows you to make sure that your baby is exclusively fed your breast milk, even at times when you are unable to breastfeed. This is especially important if you go back to work while you are still breastfeeding, or if you are not able to be present during feedings. Your lactation consultant can help you find a method of pumping that works best for you and give you guidelines about how long it is safe to store breast milk. Caring for your breasts while you breastfeed Nipples can become dry, cracked, and sore while breastfeeding. The following recommendations can help keep your breasts moisturized and healthy:  Avoid using soap on your nipples.  Wear a supportive bra designed especially for nursing. Avoid wearing underwire-style bras or extremely tight bras (sports bras).  Air-dry your nipples for 3-4 minutes after each feeding.  Use only cotton bra pads to absorb leaked breast milk. Leaking of breast milk between feedings is normal.  Use lanolin on your nipples after breastfeeding. Lanolin helps to maintain your skin's normal moisture barrier. Pure lanolin is not harmful (not toxic) to your baby. You may also hand express a few drops of breast milk and gently massage that milk into your nipples and allow the milk to air-dry. In the first few weeks after giving birth, some women experience breast engorgement. Engorgement can make your breasts feel heavy, warm, and tender to the touch. Engorgement peaks within 3-5 days after you give birth. The following recommendations can help to ease engorgement:  Completely empty your breasts while  breastfeeding or pumping. You may want to start by applying warm, moist heat (in the shower or with warm, water-soaked hand towels) just before feeding or pumping. This increases circulation and helps the milk flow. If your baby does not completely empty your breasts while breastfeeding, pump any extra milk after he or she is finished.  Apply ice packs to your breasts immediately after breastfeeding or pumping, unless this is too uncomfortable for you. To do this: ? Put ice in a plastic bag. ? Place a towel between your skin and the bag. ? Leave the ice on for 20 minutes, 2-3 times a day.  Make sure that your baby is latched on and positioned properly while breastfeeding. If engorgement persists after 48 hours of following these recommendations, contact your health care provider or a Advertising copywriter. Overall health care recommendations while breastfeeding  Eat 3 healthy meals and 3 snacks every day. Well-nourished mothers who are breastfeeding need an additional 450-500 calories a day. You can meet this requirement by increasing the amount of a balanced diet that you eat.  Drink enough water to keep your urine pale yellow or clear.  Rest often, relax, and continue to take your prenatal vitamins to prevent fatigue, stress, and low vitamin and mineral  levels in your body (nutrient deficiencies).  Do not use any products that contain nicotine or tobacco, such as cigarettes and e-cigarettes. Your baby may be harmed by chemicals from cigarettes that pass into breast milk and exposure to secondhand smoke. If you need help quitting, ask your health care provider.  Avoid alcohol.  Do not use illegal drugs or marijuana.  Talk with your health care provider before taking any medicines. These include over-the-counter and prescription medicines as well as vitamins and herbal supplements. Some medicines that may be harmful to your baby can pass through breast milk.  It is possible to become pregnant  while breastfeeding. If birth control is desired, ask your health care provider about options that will be safe while breastfeeding your baby. Where to find more information: [employer]: www.llli.org Contact a health care provider if:  You feel like you want to stop breastfeeding or have become frustrated with breastfeeding.  Your nipples are cracked or bleeding.  Your breasts are red, tender, or warm.  You have: ? Painful breasts or nipples. ? A swollen area on either breast. ? A fever or chills. ? Nausea or vomiting. ? Drainage other than breast milk from your nipples.  Your breasts do not become full before feedings by the fifth day after you give birth.  You feel sad and depressed.  Your baby is: ? Too sleepy to eat well. ? Having trouble sleeping. ? More than 34 week old and wetting fewer than 6 diapers in a 24-hour period. ? Not gaining weight by 39 days of age.  Your baby has fewer than 3 stools in a 24-hour period.  Your baby's skin or the white parts of his or her eyes become yellow. Get help right away if:  Your baby is overly tired (lethargic) and does not want to wake up and feed.  Your baby develops an unexplained fever. Summary  Breastfeeding offers many health benefits for infant and mothers.  Try to breastfeed your infant when he or she shows early signs of hunger.  Gently tickle or stroke your baby's lips with your finger or nipple to allow the baby to open his or her mouth. Bring the baby to your breast. Make sure that much of the areola is in your baby's mouth. Offer one side and burp the baby before you offer the other side.  Talk with your health care provider or lactation consultant if you have questions or you face problems as you breastfeed. This information is not intended to replace advice given to you by your health care provider. Make sure you discuss any questions you have with your health care provider. Document Released:  02/25/2005 Document Revised: 03/29/2016 Document Reviewed: 03/29/2016 Elsevier Interactive Patient Education  2019 [employer].    Home Care Instructions for Mom Activity  Gradually return to your regular activities.  Let yourself rest. Nap while your baby sleeps.  Avoid lifting anything that is heavier than 10 lb (4.5 kg) until your health care provider says it is okay.  Avoid activities that take a lot of effort and energy (are strenuous) until approved by your health care provider. Walking at a slow-to-moderate pace is usually safe.  If you had a cesarean delivery: ? Do not vacuum, climb stairs, or drive a car for 4-6 weeks. ? Have someone help you at home until you feel like you can do your usual activities yourself. ? Do exercises as told by your health care provider, if this applies. Vaginal  bleeding You may continue to bleed for 4-6 weeks after delivery. Over time, the amount of blood usually decreases and the color of the blood usually gets lighter. However, the flow of bright red blood may increase if you have been too active. If you need to use more than one pad in an hour because your pad gets soaked, or if you pass a large clot:  Lie down.  Raise your feet.  Place a cold compress on your lower abdomen.  Rest.  Call your health care provider. If you are breastfeeding, your period should return anytime between 8 weeks after delivery and the time that you stop breastfeeding. If you are not breastfeeding, your period should return 6-8 weeks after delivery. Perineal care The perineal area, or perineum, is the part of your body between your thighs. After delivery, this area needs special care. Follow these instructions as told by your health care provider.  Take warm tub baths for 15-20 minutes.  Use medicated pads and pain-relieving sprays and creams as told.  Do not use tampons or douches until vaginal bleeding has stopped.  Each time you go to the bathroom: ? Use  a peri bottle. ? Change your pad. ? Use towelettes in place of toilet paper until your stitches have healed.  Do Kegel exercises every day. Kegel exercises help to maintain the muscles that support the vagina, bladder, and bowels. You can do these exercises while you are standing, sitting, or lying down. To do Kegel exercises: ? Tighten the muscles of your abdomen and the muscles that surround your birth canal. ? Hold for a few seconds. ? Relax. ? Repeat until you have done this 5 times in a row.  To prevent hemorrhoids from developing or getting worse: ? Drink enough fluid to keep your urine clear or pale yellow. ? Avoid straining when having a bowel movement. ? Take over-the-counter medicines and stool softeners as told by your health care provider. Breast care  Wear a tight-fitting bra.  Avoid taking over-the-counter pain medicine for breast discomfort.  Apply ice to the breasts to help with discomfort as needed: ? Put ice in a plastic bag. ? Place a towel between your skin and the bag. ? Leave the ice on for 20 minutes or as told by your health care provider. Nutrition  Eat a well-balanced diet.  Do not try to lose weight quickly by cutting back on calories.  Take your prenatal vitamins until your postpartum checkup or until your health care provider tells you to stop. Postpartum depression You may find yourself crying for no apparent reason and unable to cope with all of the changes that come with having a newborn. This mood is called postpartum depression. Postpartum depression happens because your hormone levels change after delivery. If you have postpartum depression, get support from your partner, friends, and family. If the depression does not go away on its own after several weeks, contact your health care provider. Breast self-exam  Do a breast self-exam each month, at the same time of the month. If you are breastfeeding, check your breasts just after a feeding, when  your breasts are less full. If you are breastfeeding and your period has started, check your breasts on day 5, 6, or 7 of your period. Report any lumps, bumps, or discharge to your health care provider. Know that breasts are normally lumpy if you are breastfeeding. This is temporary, and it is not a health risk. Intimacy and sexuality Avoid sexual activity for  at least 3-4 weeks after delivery or until the brownish-red vaginal flow is completely gone. If you want to avoid pregnancy, use some form of birth control. You can get pregnant after delivery, even if you have not had your period. Contact a health care provider if:  You feel unable to cope with the changes that a child brings to your life, and these feelings do not go away after several weeks.  You notice a lump, a bump, or discharge on your breast. Get help right away if:  Blood soaks your pad in 1 hour or less.  You have: ? Severe pain or cramping in your lower abdomen. ? A bad-smelling vaginal discharge. ? A fever that is not controlled by medicine. ? A fever, and an area of your breast is red and sore. ? Pain or redness in your calf. ? Sudden, severe chest pain. ? Shortness of breath. ? Painful or bloody urination. ? Problems with your vision. ? You vomit for 12 hours or longer. ? You develop a severe headache. ? You have serious thoughts about hurting yourself, your child, or anyone else. This information is not intended to replace advice given to you by your health care provider. Make sure you discuss any questions you have with your health care provider. Document Released: 02/23/2000 Document Revised: 04/23/2017 Document Reviewed: 08/29/2014 Elsevier Interactive Patient Education  2019 [employer] Postpartum Care After Vaginal Delivery This sheet gives you information about how to care for yourself from the time you deliver your baby to up to 6-12 weeks after delivery (postpartum period). Your health care provider may  also give you more specific instructions. If you have problems or questions, contact your health care provider. Follow these instructions at home: Vaginal bleeding  It is normal to have vaginal bleeding (lochia) after delivery. Wear a sanitary pad for vaginal bleeding and discharge. ? During the first week after delivery, the amount and appearance of lochia is often similar to a menstrual period. ? Over the next few weeks, it will gradually decrease to a dry, yellow-brown discharge. ? For most women, lochia stops completely by 4-6 weeks after delivery. Vaginal bleeding can vary from woman to woman.  Change your sanitary pads frequently. Watch for any changes in your flow, such as: ? A sudden increase in volume. ? A change in color. ? Large blood clots.  If you pass a blood clot from your vagina, save it and call your health care provider to discuss. Do not flush blood clots down the toilet before talking with your health care provider.  Do not use tampons or douches until your health care provider says this is safe.  If you are not breastfeeding, your period should return 6-8 weeks after delivery. If you are feeding your child breast milk only (exclusive breastfeeding), your period may not return until you stop breastfeeding. Perineal care  Keep the area between the vagina and the anus (perineum) clean and dry as told by your health care provider. Use medicated pads and pain-relieving sprays and creams as directed.  If you had a cut in the perineum (episiotomy) or a tear in the vagina, check the area for signs of infection until you are healed. Check for: ? More redness, swelling, or pain. ? Fluid or blood coming from the cut or tear. ? Warmth. ? Pus or a bad smell.  You may be given a squirt bottle to use instead of wiping to clean the perineum area after you go to the bathroom.  As you start healing, you may use the squirt bottle before wiping yourself. Make sure to wipe gently.  To  relieve pain caused by an episiotomy, a tear in the vagina, or swollen veins in the anus (hemorrhoids), try taking a warm sitz bath 2-3 times a day. A sitz bath is a warm water bath that is taken while you are sitting down. The water should only come up to your hips and should cover your buttocks. Breast care  Within the first few days after delivery, your breasts may feel heavy, full, and uncomfortable (breast engorgement). Milk may also leak from your breasts. Your health care provider can suggest ways to help relieve the discomfort. Breast engorgement should go away within a few days.  If you are breastfeeding: ? Wear a bra that supports your breasts and fits you well. ? Keep your nipples clean and dry. Apply creams and ointments as told by your health care provider. ? You may need to use breast pads to absorb milk that leaks from your breasts. ? You may have uterine contractions every time you breastfeed for up to several weeks after delivery. Uterine contractions help your uterus return to its normal size. ? If you have any problems with breastfeeding, work with your health care provider or [employer]writer.  If you are not breastfeeding: ? Avoid touching your breasts a lot. Doing this can make your breasts produce more milk. ? Wear a good-fitting bra and use cold packs to help with swelling. ? Do not squeeze out (express) milk. This causes you to make more milk. Intimacy and sexuality  Ask your health care provider when you can engage in sexual activity. This may depend on: ? Your risk of infection. ? How fast you are healing. ? Your comfort and desire to engage in sexual activity.  You are able to get pregnant after delivery, even if you have not had your period. If desired, talk with your health care provider about methods of birth control (contraception). Medicines  Take over-the-counter and prescription medicines only as told by your health care provider.  If you were  prescribed an antibiotic medicine, take it as told by your health care provider. Do not stop taking the antibiotic even if you start to feel better. Activity  Gradually return to your normal activities as told by your health care provider. Ask your health care provider what activities are safe for you.  Rest as much as possible. Try to rest or take a nap while your baby is sleeping. Eating and drinking   Drink enough fluid to keep your urine pale yellow.  Eat high-fiber foods every day. These may help prevent or relieve constipation. High-fiber foods include: ? Whole grain cereals and breads. ? Brown rice. ? Beans. ? Fresh fruits and vegetables.  Do not try to lose weight quickly by cutting back on calories.  Take your prenatal vitamins until your postpartum checkup or until your health care provider tells you it is okay to stop. Lifestyle  Do not use any products that contain nicotine or tobacco, such as cigarettes and e-cigarettes. If you need help quitting, ask your health care provider.  Do not drink alcohol, especially if you are breastfeeding. General instructions  Keep all follow-up visits for you and your baby as told by your health care provider. Most women visit their health care provider for a postpartum checkup within the first 3-6 weeks after delivery. Contact a health care provider if:  You feel unable to cope  with the changes that your child brings to your life, and these feelings do not go away.  You feel unusually sad or worried.  Your breasts become red, painful, or hard.  You have a fever.  You have trouble holding urine or keeping urine from leaking.  You have little or no interest in activities you used to enjoy.  You have not breastfed at all and you have not had a menstrual period for 12 weeks after delivery.  You have stopped breastfeeding and you have not had a menstrual period for 12 weeks after you stopped breastfeeding.  You have questions  about caring for yourself or your baby.  You pass a blood clot from your vagina. Get help right away if:  You have chest pain.  You have difficulty breathing.  You have sudden, severe leg pain.  You have severe pain or cramping in your lower abdomen.  You bleed from your vagina so much that you fill more than one sanitary pad in one hour. Bleeding should not be heavier than your heaviest period.  You develop a severe headache.  You faint.  You have blurred vision or spots in your vision.  You have bad-smelling vaginal discharge.  You have thoughts about hurting yourself or your baby. If you ever feel like you may hurt yourself or others, or have thoughts about taking your own life, get help right away. You can go to the nearest emergency department or call:  Your local emergency services (911 in the [place]  A suicide crisis helpline, such as the National Suicide Prevention Lifeline at 607-828-4788. This is open 24 hours a day. Summary  The period of time right after you deliver your newborn up to 6-12 weeks after delivery is called the postpartum period.  Gradually return to your normal activities as told by your health care provider.  Keep all follow-up visits for you and your baby as told by your health care provider. This information is not intended to replace advice given to you by your health care provider. Make sure you discuss any questions you have with your health care provider. Document Released: 12/23/2006 Document Revised: 12/09/2016 Document Reviewed: 12/09/2016 Elsevier Interactive Patient Education  2019 [employer]. Postpartum Baby Blues The postpartum period begins right after the birth of a baby. During this time, there is often a lot of joy and excitement. It is also a time of many changes in the life of the parents. No matter how many times a mother gives birth, each child brings new challenges to the family, including different ways of relating to one  another. It is common to have feelings of excitement along with confusing changes in moods, emotions, and thoughts. You may feel happy one minute and sad or stressed the next. These feelings of sadness usually happen in the period right after you have your baby, and they go away within a week or two. This is called the "baby blues." What are the causes? There is no known cause of baby blues. It is likely caused by a combination of factors. However, changes in hormone levels after childbirth are believed to trigger some of the symptoms. Other factors that can play a role in these mood changes include:  Lack of sleep.  Stressful life events, such as poverty, caring for a loved one, or death of a loved one.  Genetics. What are the signs or symptoms? Symptoms of this condition include:  Brief changes in mood, such as going from extreme happiness  to sadness.  Decreased concentration.  Difficulty sleeping.  Crying spells and tearfulness.  Loss of appetite.  Irritability.  Anxiety. If the symptoms of baby blues last for more than 2 weeks or become more severe, you may have postpartum depression. How is this diagnosed? This condition is diagnosed based on an evaluation of your symptoms. There are no medical or lab tests that lead to a diagnosis, but there are various questionnaires that a health care provider may use to identify women with the baby blues or postpartum depression. How is this treated? Treatment is not needed for this condition. The baby blues usually go away on their own in 1-2 weeks. Social support is often all that is needed. You will be encouraged to get adequate sleep and rest. Follow these instructions at home: Lifestyle      Get as much rest as you can. Take a nap when the baby sleeps.  Exercise regularly as told by your health care provider. Some women find yoga and walking to be helpful.  Eat a balanced and nourishing diet. This includes plenty of fruits and  vegetables, whole grains, and lean proteins.  Do little things that you enjoy. Have a cup of tea, take a bubble bath, read your favorite magazine, or listen to your favorite music.  Avoid alcohol.  Ask for help with household chores, cooking, grocery shopping, or running errands. Do not try to do everything yourself. Consider hiring a postpartum doula to help. This is a professional who specializes in providing support to new mothers.  Try not to make any major life changes during pregnancy or right after giving birth. This can add stress. General instructions  Talk to people close to you about how you are feeling. Get support from your partner, family members, friends, or other new moms. You may want to join a support group.  Find ways to cope with stress. This may include: ? Writing your thoughts and feelings in a journal. ? Spending time outside. ? Spending time with people who make you laugh.  Try to stay positive in how you think. Think about the things you are grateful for.  Take over-the-counter and prescription medicines only as told by your health care provider.  Let your health care provider know if you have any concerns.  Keep all postpartum visits as told by your health care provider. This is important. Contact a health care provider if:  Your baby blues do not go away after 2 weeks. Get help right away if:  You have thoughts of taking your own life (suicidal thoughts).  You think you may harm the baby or other people.  You see or hear things that are not there (hallucinations). Summary  After giving birth, you may feel happy one minute and sad or stressed the next. Feelings of sadness that happen right after the baby is born and go away after a week or two are called the "baby blues."  You can manage the baby blues by getting enough rest, eating a healthy diet, exercising, spending time with supportive people, and finding ways to cope with stress.  If feelings of  sadness and stress last longer than 2 weeks or get in the way of caring for your baby, talk to your health care provider. This may mean you have postpartum depression. This information is not intended to replace advice given to you by your health care provider. Make sure you discuss any questions you have with your health care provider. Document Released: 11/30/2003  Document Revised: 04/23/2016 Document Reviewed: 04/23/2016 Elsevier Interactive Patient Education  [employer].

## 2018-05-31 ENCOUNTER — Ambulatory Visit: Payer: Self-pay

## 2018-05-31 LAB — GLUCOSE, CAPILLARY: Glucose-Capillary: 89 mg/dL (ref 70–99)

## 2018-05-31 LAB — RPR: RPR Ser Ql: NONREACTIVE

## 2018-05-31 NOTE — Progress Notes (Signed)
Discharge instructions given. Patient verbalizes understanding of teaching. Patient discharged home via wheelchair at 1530. 

## 2018-05-31 NOTE — Lactation Note (Signed)
This note was copied from a baby's chart. Lactation Consultation Note  Patient Name: Crystal Shepard Date: 05/31/2018  [name] has been cluster feeding.  Mom has breast fed on both breasts using nipple shield for 40 minutes each, hand expressed and then pumped.  Mom's nipples  are sore with positional stripes.  [name] is still crying and rooting even when put skin to skin with mom and when father of baby holds.  Mom wanting to give supplementation, but does not want to give formula.  Agreed to donor milk to be given via SNS at the breast or finger feeding.  Order received and consent signed.  Demonstrated how to use SNS with father of baby finger feeding and/or at mom's breast.  [name] took 15 ml donor milk by finger feed for this time because mom did not think she could put him back to the breast right now d//t soreness.  If further supplementation needed, mom would try the SNS at the breast.  Reviewed supply and demand and need to continue to stimulate the breast to bring in mature milk and ensure a plentiful milk supply.  Parents understand.  Mom put [name] to the breast at next feeding without supplementation.  Will give remainder of bottle of donor milk to mom for discharge home.  Community lactation resources reviewed and encouraged to call with any questions, concerns or assistance.                    Maternal Data    Feeding Feeding Type: Breast Milk  LATCH Score                   Interventions    Lactation Tools Discussed/Used     Consult Status      Louis Meckel 05/31/2018, 4:44 PM

## 2018-06-01 LAB — VITAMIN D 25 HYDROXY (VIT D DEFICIENCY, FRACTURES): VIT D 25 HYDROXY: 21.1 ng/mL — AB (ref 30.0–100.0)

## 2018-06-04 ENCOUNTER — Encounter: Payer: Self-pay | Admitting: Certified Nurse Midwife

## 2018-06-05 ENCOUNTER — Other Ambulatory Visit: Payer: Self-pay | Admitting: Certified Nurse Midwife

## 2018-06-05 DIAGNOSIS — E559 Vitamin D deficiency, unspecified: Secondary | ICD-10-CM

## 2018-06-05 MED ORDER — VITAMIN D (ERGOCALCIFEROL) 1.25 MG (50000 UNIT) PO CAPS: 50000.0000 [IU] | ORAL_CAPSULE | ORAL | 0 refills | Status: DC

## 2018-07-13 ENCOUNTER — Other Ambulatory Visit: Payer: Self-pay

## 2018-07-13 ENCOUNTER — Ambulatory Visit (INDEPENDENT_AMBULATORY_CARE_PROVIDER_SITE_OTHER): Payer: BC Managed Care – PPO | Admitting: Certified Nurse Midwife

## 2018-07-13 DIAGNOSIS — E559 Vitamin D deficiency, unspecified: Secondary | ICD-10-CM

## 2018-07-13 DIAGNOSIS — Z862 Personal history of diseases of the blood and blood-forming organs and certain disorders involving the immune mechanism: Secondary | ICD-10-CM

## 2018-07-13 DIAGNOSIS — Z8632 Personal history of gestational diabetes: Secondary | ICD-10-CM

## 2018-07-13 DIAGNOSIS — Z3009 Encounter for other general counseling and advice on contraception: Secondary | ICD-10-CM

## 2018-07-13 MED ORDER — NORETHIN ACE-ETH ESTRAD-FE 1-20 MG-MCG PO TABS: 1.0000 | ORAL_TABLET | Freq: Every day | ORAL | 4 refills | Status: DC

## 2018-07-13 NOTE — Progress Notes (Signed)
Pt is here for a PPV. Is both breast and bottle feeding, would like to start OCPs, has not resumed intercourse. 7 on depression screening. LMP 07/04/18

## 2018-07-13 NOTE — Patient Instructions (Addendum)
Preventive Care 18-39 Years, Female Preventive care refers to lifestyle choices and visits with your health care provider that can promote health and wellness. What does preventive care include?   A yearly physical exam. This is also called an annual well check.  Dental exams once or twice a year.  Routine eye exams. Ask your health care provider how often you should have your eyes checked.  Personal lifestyle choices, including: ? Daily care of your teeth and gums. ? Regular physical activity. ? Eating a healthy diet. ? Avoiding tobacco and drug use. ? Limiting alcohol use. ? Practicing safe sex. ? Taking vitamin and mineral supplements as recommended by your health care provider. What happens during an annual well check? The services and screenings done by your health care provider during your annual well check will depend on your age, overall health, lifestyle risk factors, and family history of disease. Counseling Your health care provider may ask you questions about your:  Alcohol use.  Tobacco use.  Drug use.  Emotional well-being.  Home and relationship well-being.  Sexual activity.  Eating habits.  Work and work environment.  Method of birth control.  Menstrual cycle.  Pregnancy history. Screening You may have the following tests or measurements:  Height, weight, and BMI.  Diabetes screening. This is done by checking your blood sugar (glucose) after you have not eaten for a while (fasting).  Blood pressure.  Lipid and cholesterol levels. These may be checked every 5 years starting at age 20.  Skin check.  Hepatitis C blood test.  Hepatitis B blood test.  Sexually transmitted disease (STD) testing.  BRCA-related cancer screening. This may be done if you have a family history of breast, ovarian, tubal, or peritoneal cancers.  Pelvic exam and Pap test. This may be done every 3 years starting at age 21. Starting at age 30, this may be done every 5  years if you have a Pap test in combination with an HPV test. Discuss your test results, treatment options, and if necessary, the need for more tests with your health care provider. Vaccines Your health care provider may recommend certain vaccines, such as:  Influenza vaccine. This is recommended every year.  Tetanus, diphtheria, and acellular pertussis (Tdap, Td) vaccine. You may need a Td booster every 10 years.  Varicella vaccine. You may need this if you have not been vaccinated.  HPV vaccine. If you are 26 or younger, you may need three doses over 6 months.  Measles, mumps, and rubella (MMR) vaccine. You may need at least one dose of MMR. You may also need a second dose.  Pneumococcal 13-valent conjugate (PCV13) vaccine. You may need this if you have certain conditions and were not previously vaccinated.  Pneumococcal polysaccharide (PPSV23) vaccine. You may need one or two doses if you smoke cigarettes or if you have certain conditions.  Meningococcal vaccine. One dose is recommended if you are age 19-21 years and a first-year college student living in a residence hall, or if you have one of several medical conditions. You may also need additional booster doses.  Hepatitis A vaccine. You may need this if you have certain conditions or if you travel or work in places where you may be exposed to hepatitis A.  Hepatitis B vaccine. You may need this if you have certain conditions or if you travel or work in places where you may be exposed to hepatitis B.  Haemophilus influenzae type b (Hib) vaccine. You may need this if you   have certain risk factors. Talk to your health care provider about which screenings and vaccines you need and how often you need them. This information is not intended to replace advice given to you by your health care provider. Make sure you discuss any questions you have with your health care provider. Document Released: 04/23/2001 Document Revised: 10/08/2016  Document Reviewed: 12/27/2014 Elsevier Interactive Patient Education  2019 Elsevier Inc. Ethinyl Estradiol; Norethindrone Acetate; Ferrous fumarate tablets or capsules What is this medicine? ETHINYL ESTRADIOL; NORETHINDRONE ACETATE; FERROUS FUMARATE (ETH in il es tra DYE ole; nor eth IN drone AS e tate; FER [place] FUE ma rate) is an oral contraceptive. The products combine two types of female hormones, an estrogen and a progestin. They are used to prevent ovulation and pregnancy. Some products are also used to treat acne in females. This medicine may be used for other purposes; ask your health care provider or pharmacist if you have questions. COMMON BRAND NAME(S): Aurovela [address] 1/20, Aurovela Fe, Blisovi [address] Fe, Estrostep Fe, Gildess 24 Fe, Gildess Fe 1.5/30, Gildess Fe 1/20, Hailey 24 Fe, Junel Fe 1.5/30, Junel Fe 1/20, Junel Fe 24, Larin Fe, Lo Loestrin Fe, Loestrin 24 Fe, Loestrin FE 1.5/30, Loestrin FE 1/20, Lomedia 24 Fe, Microgestin 24 Fe, Microgestin Fe 1.5/30, Microgestin Fe 1/20, Tarina 24 Fe, Tarina Fe 1/20, Taytulla, Tilia Fe, Tri-Legest Fe What should I tell my health care provider before I take this medicine? They need to know if you have any of these conditions: -abnormal vaginal bleeding -blood vessel disease -breast, cervical, endometrial, ovarian, liver, or uterine cancer -diabetes -gallbladder disease -heart disease or recent heart attack -high blood pressure -high cholesterol -history of blood clots -kidney disease -liver disease -migraine headaches -smoke tobacco -stroke -systemic lupus erythematosus (SLE) -an unusual or allergic reaction to estrogens, progestins, other medicines, foods, dyes, or preservatives -pregnant or trying to get pregnant -breast-feeding How should I use this medicine? Take this medicine by mouth. To reduce nausea, this medicine may be taken with food. Follow the directions on the prescription label. Take this medicine at the same time  each day and in the order directed on the package. Do not take your medicine more often than directed. A patient package insert for the product will be given with each prescription and refill. Read this sheet carefully each time. The sheet may change frequently. Contact your pediatrician regarding the use of this medicine in children. Special care may be needed. This medicine has been used in female children who have started having menstrual periods. Overdosage: If you think you have taken too much of this medicine contact a poison control center or emergency room at once. NOTE: This medicine is only for you. Do not share this medicine with others. What if I miss a dose? If you miss a dose, refer to the patient information sheet you received with your medicine for direction. If you miss more than one pill, this medicine may not be as effective and you may need to use another form of birth control. What may interact with this medicine? Do not take this medicine with the following medication: -dasabuvir; ombitasvir; paritaprevir; ritonavir -ombitasvir; paritaprevir; ritonavir This medicine may also interact with the following medications: -acetaminophen -antibiotics or medicines for infections, especially rifampin, rifabutin, rifapentine, and griseofulvin, and possibly penicillins or tetracyclines -aprepitant -ascorbic acid (vitamin C) -atorvastatin -barbiturate medicines, such as phenobarbital -bosentan -carbamazepine -caffeine -clofibrate -cyclosporine -dantrolene -doxercalciferol -felbamate -grapefruit juice -hydrocortisone -medicines for anxiety or sleeping problems, such as  diazepam or temazepam -medicines for diabetes, including pioglitazone -mineral oil -modafinil -mycophenolate -nefazodone -oxcarbazepine -phenytoin -prednisolone -ritonavir or other medicines for HIV infection or AIDS -rosuvastatin -selegiline -soy isoflavones supplements -St. John's wort -tamoxifen or  raloxifene -theophylline -thyroid hormones -topiramate -warfarin This list may not describe all possible interactions. Give your health care provider a list of all the medicines, herbs, non-prescription drugs, or dietary supplements you use. Also tell them if you smoke, drink alcohol, or use illegal drugs. Some items may interact with your medicine. What should I watch for while using this medicine? Visit your doctor or health care professional for regular checks on your progress. You will need a regular breast and pelvic exam and Pap smear while on this medicine. Use an additional method of contraception during the first cycle that you take these tablets. If you have any reason to think you are pregnant, stop taking this medicine right away and contact your doctor or health care professional. If you are taking this medicine for hormone related problems, it may take several cycles of use to see improvement in your condition. Smoking increases the risk of getting a blood clot or having a stroke while you are taking birth control pills, especially if you are more than 25 years old. You are strongly advised not to smoke. This medicine can make your body retain fluid, making your fingers, hands, or ankles swell. Your blood pressure can go up. Contact your doctor or health care professional if you feel you are retaining fluid. This medicine can make you more sensitive to the sun. Keep out of the sun. If you cannot avoid being in the sun, wear protective clothing and use sunscreen. Do not use sun lamps or tanning beds/booths. If you wear contact lenses and notice visual changes, or if the lenses begin to feel uncomfortable, consult your eye care specialist. In some women, tenderness, swelling, or minor bleeding of the gums may occur. Notify your dentist if this happens. Brushing and flossing your teeth regularly may help limit this. See your dentist regularly and inform your dentist of the medicines you are  taking. If you are going to have elective surgery, you may need to stop taking this medicine before the surgery. Consult your health care professional for advice. This medicine does not protect you against HIV infection (AIDS) or any other sexually transmitted diseases. What side effects may I notice from receiving this medicine? Side effects that you should report to your doctor or health care professional as soon as possible: -allergic reactions like skin rash, itching or hives, swelling of the face, lips, or tongue -breast tissue changes or discharge -changes in vaginal bleeding during your period or between your periods -changes in vision -chest pain -confusion -coughing up blood -dizziness -feeling faint or lightheaded -headaches or migraines -leg, arm or groin pain -loss of balance or coordination -severe or sudden headaches -stomach pain (severe) -sudden shortness of breath -sudden numbness or weakness of the face, arm or leg -symptoms of vaginal infection like itching, irritation or unusual discharge -tenderness in the upper abdomen -trouble speaking or understanding -vomiting -yellowing of the eyes or skin Side effects that usually do not require medical attention (report to your doctor or health care professional if they continue or are bothersome): -breakthrough bleeding and spotting that continues beyond the 3 initial cycles of pills -breast tenderness -mood changes, anxiety, depression, frustration, anger, or emotional outbursts -increased sensitivity to sun or ultraviolet light -nausea -skin rash, acne, or brown spots on the skin -  weight gain (slight) This list may not describe all possible side effects. Call your doctor for medical advice about side effects. You may report side effects to FDA at 1-800-FDA-1088. Where should I keep my medicine? Keep out of the reach of children. Store at room temperature between 15 and 30 degrees C (59 and 86 degrees F). Throw away  any unused medicine after the expiration date. NOTE: This sheet is a summary. It may not cover all possible information. If you have questions about this medicine, talk to your doctor, pharmacist, or health care provider.  2019 Elsevier/Gold Standard (2015-11-06 08:04:41)

## 2018-07-13 NOTE — Progress Notes (Signed)
Subjective:    Crystal Shepard is a 23 y.o. G82P1001 Caucasian female who presents for a postpartum visit. She is 6 weeks postpartum following a spontaneous vaginal delivery at 39+6 gestational weeks. Anesthesia: local. I have fully reviewed the prenatal and intrapartum course.   Postpartum course has been uncomplicated. Baby's course has been uncomplicated. Baby is feeding by formula. Bleeding no bleeding. Bowel function is normal. Bladder function is normal.   Patient is not sexually active. Contraception method is OCP (estrogen/progesterone). Postpartum depression screening: negative,  Score 7; taking Prozac 10 mg PO daily.  Last pap 2014 and was normal.  Denies difficulty breathing or respiratory distress, chest pain, abdominal pain, vaginal bleeding, dysuria, and leg pain or swelling.   The following portions of the patient's history were reviewed and updated as appropriate: allergies, current medications, past medical history, past surgical history and problem list.  Review of Systems  Pertinent items are noted in HPI.   Objective:   BP 105/85   Pulse 76   Ht 5\' 6"  (1.676 m)   Wt 212 lb 8 oz (96.4 kg)   LMP 07/04/2018 (Approximate)   Breastfeeding No   BMI 34.30 kg/m   General:  alert, cooperative and no distress   Breasts:  deferred, no complaints  Lungs: clear to auscultation bilaterally  Heart:  regular rate and rhythm  Abdomen: soft, nontender   Vulva: normal  Vagina: normal vagina  Cervix:  closed  Corpus: Well-involuted  Adnexa:  Non-palpable        Depression screen [facility] 2/9 07/13/2018 04/06/2018 02/13/2018  Decreased Interest 2 0 2  Down, Depressed, Hopeless 1 0 1  PHQ - 2 Score 3 0 3  Altered sleeping 0 - 0  Tired, decreased energy 0 - 1  Change in appetite 1 - 0  Feeling bad or failure about yourself  3 - 2  Trouble concentrating 0 - 0  Moving slowly or fidgety/restless 0 - 1  Suicidal thoughts 0 - 0  PHQ-9 Score 7 - 7  Difficult doing work/chores Somewhat  difficult - Very difficult    Assessment:   Postpartum exam Six (6) wks s/p spontaneous vaginal birth Formula feeding Depression screening Contraception counseling  History of gestational diabetes  Plan:   Encouraged routine health maintenance techniques.   May return to work without restrictions.   Rx: Junel, see orders.   Reviewed red flag symptoms and when to call.   Follow up in: 6 weeks for postpartum labs or earlier if needed.   RTC x 4 months for ANNUAL EXAM with Pap.      Gunnar Bulla, CNM [facility], [facility] 07/13/18 11:48 AM

## 2018-07-14 ENCOUNTER — Encounter: Payer: BC Managed Care – PPO | Admitting: Certified Nurse Midwife

## 2018-08-21 ENCOUNTER — Telehealth: Payer: Self-pay

## 2018-08-21 NOTE — Telephone Encounter (Signed)
Coronavirus (COVID-19) Are you at risk?  Are you at risk for the Coronavirus (COVID-19)?  To be considered HIGH RISK for Coronavirus (COVID-19), you have to meet the following criteria:  . Traveled to [place], [place], [place], [place] or [place]; or in the [place] to [place], [place], [place], or [place]; and have fever, cough, and shortness of breath within the last 2 weeks of travel OR . Been in close contact with a person diagnosed with COVID-19 within the last 2 weeks and have fever, cough, and shortness of breath . IF YOU DO NOT MEET THESE CRITERIA, YOU ARE CONSIDERED LOW RISK FOR COVID-19.  What to do if you are HIGH RISK for COVID-19?  . If you are having a medical emergency, call 911. . Seek medical care right away. Before you go to a doctor's office, urgent care or emergency department, call ahead and tell them about your recent travel, contact with someone diagnosed with COVID-19, and your symptoms. You should receive instructions from your physician's office regarding next steps of care.  . When you arrive at healthcare provider, tell the healthcare staff immediately you have returned from visiting [place], [place], [place], [place] or [place]; or traveled in the [place] to [place], [place], [place], or [place]; in the last two weeks or you have been in close contact with a person diagnosed with COVID-19 in the last 2 weeks.   . Tell the health care staff about your symptoms: fever, cough and shortness of breath. . After you have been seen by a medical provider, you will be either: o Tested for (COVID-19) and discharged home on quarantine except to seek medical care if symptoms worsen, and asked to  - Stay home and avoid contact with others until you get your results (4-5 days)  - Avoid travel on public transportation if possible (such as bus, train, or airplane) or o Sent to the Emergency Department by EMS for evaluation, COVID-19 testing, and possible  admission depending on your condition and test results.  What to do if you are LOW RISK for COVID-19?  Reduce your risk of any infection by using the same precautions used for avoiding the common cold or flu:  . Wash your hands often with soap and warm water for at least 20 seconds.  If soap and water are not readily available, use an alcohol-based hand sanitizer with at least 60% alcohol.  . If coughing or sneezing, cover your mouth and nose by coughing or sneezing into the elbow areas of your shirt or coat, into a tissue or into your sleeve (not your hands). . Avoid shaking hands with others and consider head nods or verbal greetings only. . Avoid touching your eyes, nose, or mouth with unwashed hands.  . Avoid close contact with people who are sick. . Avoid places or events with large numbers of people in one location, like concerts or sporting events. . Carefully consider travel plans you have or are making. . If you are planning any travel outside or inside the [place], visit the CDC's Travelers' Health webpage for the latest health notices. . If you have some symptoms but not all symptoms, continue to monitor at home and seek medical attention if your symptoms worsen. . If you are having a medical emergency, call 911.   ADDITIONAL HEALTHCARE OPTIONS FOR PATIENTS  [facility] / e-Visit: [email or web address]         [facility]: [phone]     Urgent Care: [phone]                   [facility]: [phone]   Prescreened. Neg .cm 

## 2018-08-24 ENCOUNTER — Encounter: Payer: Self-pay | Admitting: Certified Nurse Midwife

## 2018-08-24 ENCOUNTER — Other Ambulatory Visit: Payer: BC Managed Care – PPO

## 2018-08-24 ENCOUNTER — Other Ambulatory Visit: Payer: Self-pay

## 2018-08-24 DIAGNOSIS — Z862 Personal history of diseases of the blood and blood-forming organs and certain disorders involving the immune mechanism: Secondary | ICD-10-CM

## 2018-08-24 DIAGNOSIS — Z8632 Personal history of gestational diabetes: Secondary | ICD-10-CM

## 2018-08-24 DIAGNOSIS — E559 Vitamin D deficiency, unspecified: Secondary | ICD-10-CM

## 2018-08-25 LAB — CBC
Hematocrit: 39.2 % (ref 34.0–46.6)
Hemoglobin: 12.7 g/dL (ref 11.1–15.9)
MCH: 29 pg (ref 26.6–33.0)
MCHC: 32.4 g/dL (ref 31.5–35.7)
MCV: 90 fL (ref 79–97)
Platelets: 265 10*3/uL (ref 150–450)
RBC: 4.38 x10E6/uL (ref 3.77–5.28)
RDW: 13.6 % (ref 11.7–15.4)
WBC: 6.2 10*3/uL (ref 3.4–10.8)

## 2018-08-25 LAB — GLUCOSE TOLERANCE, 2 HOURS
Glucose, 2 hour: 113 mg/dL (ref 65–139)
Glucose, GTT - Fasting: 87 mg/dL (ref 65–99)

## 2018-08-25 LAB — VITAMIN D 25 HYDROXY (VIT D DEFICIENCY, FRACTURES): Vit D, 25-Hydroxy: 29.3 ng/mL — ABNORMAL LOW (ref 30.0–100.0)

## 2018-08-25 NOTE — Telephone Encounter (Signed)
Yes, she may increase dose. Have her make an appointment for 6-8 wks for mood check. JML

## 2018-09-08 ENCOUNTER — Encounter: Payer: Self-pay | Admitting: Certified Nurse Midwife

## 2018-09-08 ENCOUNTER — Other Ambulatory Visit: Payer: Self-pay

## 2018-09-08 MED ORDER — FLUOXETINE HCL 20 MG PO CAPS: 20.0000 mg | ORAL_CAPSULE | Freq: Every day | ORAL | 3 refills | Status: DC

## 2020-04-17 ENCOUNTER — Other Ambulatory Visit: Payer: Self-pay

## 2020-04-17 ENCOUNTER — Ambulatory Visit (INDEPENDENT_AMBULATORY_CARE_PROVIDER_SITE_OTHER): Payer: Medicaid Other | Admitting: Certified Nurse Midwife

## 2020-04-17 ENCOUNTER — Encounter: Payer: Self-pay | Admitting: Certified Nurse Midwife

## 2020-04-17 VITALS — BP 94/71 | HR 88 | Resp 16 | Wt 212.6 lb

## 2020-04-17 DIAGNOSIS — Z8632 Personal history of gestational diabetes: Secondary | ICD-10-CM | POA: Diagnosis not present

## 2020-04-17 DIAGNOSIS — O09299 Supervision of pregnancy with other poor reproductive or obstetric history, unspecified trimester: Secondary | ICD-10-CM | POA: Diagnosis not present

## 2020-04-17 DIAGNOSIS — Z3201 Encounter for pregnancy test, result positive: Secondary | ICD-10-CM | POA: Diagnosis not present

## 2020-04-17 LAB — POCT URINE PREGNANCY: Preg Test, Ur: POSITIVE — AB

## 2020-04-17 NOTE — Progress Notes (Signed)
GYN ENCOUNTER NOTE  Subjective:       Crystal Shepard is a 25 y.o. G50P1001 female here for pregnancy confirmation.   Reports positive home pregnancy test. Endorses nausea without vomiting, breast tenderness and light cramping.   Taking prenatal vitamin with folic acid and DHA.   Denies difficulty breathing or respiratory distress, chest pain, abdominal pain, vaginal bleeding, dysuria and leg pain or swelling.   History significant for gestational diabetes in last pregnancy.    Gynecologic History  Patient's last menstrual period was 02/28/2020 (approximate).   Gestational age: 30 weeks 0 days  Estimated date of birth: 12/04/2020  Contraception: none  Last Pap: 2014. Results were: normal  Obstetric History  OB History  Gravida Para Term Preterm AB Living  2 1 1     1   SAB IAB Ectopic Multiple Live Births        0 1    # Outcome Date GA Lbr Len/2nd Weight Sex Delivery Anes PTL Lv  2 Current           1 Term 05/29/18 [redacted]w[redacted]d / 00:25 8 lb 5.7 oz (3.79 kg) M Vag-Spont None  LIV    Past Medical History:  Diagnosis Date  . Anemia     Past Surgical History:  Procedure Laterality Date  . NO PAST SURGERIES      Current Outpatient Medications on File Prior to Visit  Medication Sig Dispense Refill  . Prenatal Vit-Fe Fumarate-FA (PRENATAL MULTIVITAMIN) TABS tablet Take 1 tablet by mouth daily at 12 noon.     No current facility-administered medications on file prior to visit.    No Known Allergies  Social History   Socioeconomic History  . Marital status: Single    Spouse name: Not on file  . Number of children: Not on file  . Years of education: Not on file  . Highest education level: Not on file  Occupational History  . Not on file  Tobacco Use  . Smoking status: Never Smoker  . Smokeless tobacco: Never Used  Vaping Use  . Vaping Use: Never used  Substance and Sexual Activity  . Alcohol use: Not Currently  . Drug use: Never  . Sexual activity: Yes     Birth control/protection: Pill  Other Topics Concern  . Not on file  Social History Narrative  . Not on file   Social Determinants of Health   Financial Resource Strain: Not on file  Food Insecurity: Not on file  Transportation Needs: Not on file  Physical Activity: Not on file  Stress: Not on file  Social Connections: Not on file  Intimate Partner Violence: Not on file    History reviewed. No pertinent family history.  The following portions of the patient's history were reviewed and updated as appropriate: allergies, current medications, past family history, past medical history, past social history, past surgical history and problem list.  Review of Systems  ROS negative except as noted above. Information obtained from patient.   Objective:   BP 94/71   Pulse 88   Resp 16   Wt 212 lb 9.6 oz (96.4 kg)   LMP 02/28/2020 (Approximate)   Breastfeeding No   BMI 34.31 kg/m    CONSTITUTIONAL: Well-developed, well-nourished female in no acute distress.   PHYSICAL EXAM: Not indicated.   Recent Results (from the past 2160 hour(s))  POCT urine pregnancy     Status: Abnormal   Collection Time: 04/17/20  2:03 PM  Result Value Ref Range   Preg  Test, Ur Positive (A) Negative    Assessment:   1. Pregnancy confirmed by positive urine test  - POCT urine pregnancy - US OB LESS THAN 14 WEEKS WITH OB TRANSVAGINAL; Future    Plan:   First trimester education, see AVS.   Reviewed red flag symptoms and when to call.   RTC x 1-2 weeks for dating/viability ultrasound & intake.   RTC x 5-6 weeks for NOB PE or sooner if needed.    Serafina Royals, CNM [facility], [facility] 04/17/20 2:20 PM

## 2020-04-17 NOTE — Patient Instructions (Signed)
WHAT OB PATIENTS CAN EXPECT   Confirmation of pregnancy and ultrasound ordered if medically indicated-[redacted] weeks gestation  New OB (NOB) intake with nurse and New OB (NOB) labs- [redacted] weeks gestation  New OB (NOB) physical examination with provider- 11/[redacted] weeks gestation  Flu vaccine-[redacted] weeks gestation  Anatomy scan-[redacted] weeks gestation  Glucose tolerance test, blood work to test for anemia, T-dap vaccine-[redacted] weeks gestation  Vaginal swabs/cultures-STD/Group B strep-[redacted] weeks gestation  Appointments every 4 weeks until 28 weeks  Every 2 weeks from 28 weeks until 36 weeks  Weekly visits from 36 weeks until delivery    Common Medications Safe in Pregnancy  Acne:      Constipation:  Benzoyl Peroxide     Colace  Clindamycin      Dulcolax Suppository  Topica Erythromycin     Fibercon  Salicylic Acid      Metamucil         Miralax AVOID:        Senakot   Accutane    Cough:  Retin-A       Cough Drops  Tetracycline      Phenergan w/ Codeine if Rx  Minocycline      Robitussin (Plain & DM)  Antibiotics:     Crabs/Lice:  Ceclor       RID  Cephalosporins    AVOID:  E-Mycins      Kwell  Keflex  Macrobid/Macrodantin   Diarrhea:  Penicillin      Kao-Pectate  Zithromax      Imodium AD         PUSH FLUIDS AVOID:       Cipro     Fever:  Tetracycline      Tylenol (Regular or Extra  Minocycline       Strength)  Levaquin      Extra Strength-Do not          Exceed 8 tabs/24 hrs Caffeine:        <298m/day (equiv. To 1 cup of coffee or  approx. 3 12 oz sodas)         Gas: Cold/Hayfever:       Gas-X  Benadryl      Mylicon  Claritin       Phazyme  **Claritin-D        Chlor-Trimeton    Headaches:  Dimetapp      ASA-Free Excedrin  Drixoral-Non-Drowsy     Cold Compress  Mucinex (Guaifenasin)     Tylenol (Regular or Extra  Sudafed/Sudafed-12 Hour     Strength)  **Sudafed PE Pseudoephedrine   Tylenol Cold & Sinus     Vicks Vapor Rub  Zyrtec  **AVOID if Problems With Blood  Pressure         Heartburn: Avoid lying down for at least 1 hour after meals  Aciphex      Maalox     Rash:  Milk of Magnesia     Benadryl    Mylanta       1% Hydrocortisone Cream  Pepcid  Pepcid Complete   Sleep Aids:  Prevacid      Ambien   Prilosec       Benadryl  Rolaids       Chamomile Tea  Tums (Limit 4/day)     Unisom         Tylenol PM         Warm milk-add vanilla or  Hemorrhoids:       Sugar for taste  Anusol/Anusol H.C.  (RX:  Analapram 2.5%)  Sugar Substitutes:  Hydrocortisone OTC     Ok in moderation  Preparation H      Tucks        Vaseline lotion applied to tissue with wiping    Herpes:     Throat:  Acyclovir      Oragel  Famvir  Valtrex     Vaccines:         Flu Shot Leg Cramps:       *Gardasil  Benadryl      Hepatitis A         Hepatitis B Nasal Spray:       Pneumovax  Saline Nasal Spray     Polio Booster         Tetanus Nausea:       Tuberculosis test or PPD  Vitamin B6 25 mg TID   AVOID:    Dramamine      *Gardasil  Emetrol       Live Poliovirus  Ginger Root 250 mg QID    MMR (measles, mumps &  High Complex Carbs @ Bedtime    rebella)  Sea Bands-Accupressure    Varicella (Chickenpox)  Unisom 1/2 tab TID     *No known complications           If received before Pain:         Known pregnancy;   Darvocet       Resume series after  Lortab        Delivery  Percocet    Yeast:   Tramadol      Femstat  Tylenol 3      Gyne-lotrimin  Ultram       Monistat  Vicodin           MISC:         All Sunscreens           Hair Coloring/highlights          Insect Repellant's          (Including DEET)         Mystic Tans   [email or web address]">  First Trimester of Pregnancy  The first trimester of pregnancy starts on the first day of your last menstrual period until the end of week 12. This is also called months 1 through 3 of pregnancy. Body changes during your first trimester Your body goes through many changes during  pregnancy. The changes usually return to normal after your baby is born. Physical changes  You may gain or lose weight.  Your breasts may grow larger and hurt. The area around your nipples may get darker.  Dark spots or blotches may develop on your face.  You may have changes in your hair. Health changes  You may feel like you might vomit (nauseous), and you may vomit.  You may have heartburn.  You may have headaches.  You may have trouble pooping (constipation).  Your gums may bleed. Other changes  You may get tired easily.  You may pee (urinate) more often.  Your menstrual periods will stop.  You may not feel hungry.  You may want to eat certain kinds of food.  You may have changes in your emotions from day to day.  You may have more dreams. Follow these instructions at home: Medicines  Take over-the-counter and prescription medicines only as told by your doctor. Some medicines are not safe during pregnancy.  Take a prenatal vitamin that contains at least 600 micrograms (mcg) of folic acid.  Eating and drinking  Eat healthy meals that include: ? Fresh fruits and vegetables. ? Whole grains. ? Good sources of protein, such as meat, eggs, or tofu. ? Low-fat dairy products.  Avoid raw meat and unpasteurized juice, milk, and cheese.  If you feel like you may vomit, or you vomit: ? Eat 4 or 5 small meals a day instead of 3 large meals. ? Try eating a few soda crackers. ? Drink liquids between meals instead of during meals.  You may need to take these actions to prevent or treat trouble pooping: ? Drink enough fluids to keep your pee (urine) pale yellow. ? Eat foods that are high in fiber. These include beans, whole grains, and fresh fruits and vegetables. ? Limit foods that are high in fat and sugar. These include fried or sweet foods. Activity  Exercise only as told by your doctor. Most people can do their usual exercise routine during pregnancy.  Stop  exercising if you have cramps or pain in your lower belly (abdomen) or low back.  Do not exercise if it is too hot or too humid, or if you are in a place of great height (high altitude).  Avoid heavy lifting.  If you choose to, you may have sex unless your doctor tells you not to. Relieving pain and discomfort  Wear a good support bra if your breasts are sore.  Rest with your legs raised (elevated) if you have leg cramps or low back pain.  If you have bulging veins (varicose veins) in your legs: ? Wear support hose as told by your doctor. ? Raise your feet for 15 minutes, 3-4 times a day. ? Limit salt in your food. Safety  Wear your seat belt at all times when you are in a car.  Talk with your doctor if someone is hurting you or yelling at you.  Talk with your doctor if you are feeling sad or have thoughts of hurting yourself. Lifestyle  Do not use hot tubs, steam rooms, or saunas.  Do not douche. Do not use tampons or scented sanitary pads.  Do not use herbal medicines, illegal drugs, or medicines that are not approved by your doctor. Do not drink alcohol.  Do not smoke or use any products that contain nicotine or tobacco. If you need help quitting, ask your doctor.  Avoid cat litter boxes and soil that is used by cats. These carry germs that can cause harm to the baby and can cause a loss of your baby by miscarriage or stillbirth. General instructions  Keep all follow-up visits. This is important.  Ask for help if you need counseling or if you need help with nutrition. Your doctor can give you advice or tell you where to go for help.  Visit your dentist. At home, brush your teeth with a soft toothbrush. Floss gently.  Write down your questions. Take them to your prenatal visits. Where to find more information  American Pregnancy Association: americanpregnancy.org  [employer]: www.acog.org  Office on Women's Health:  KeywordPortfolios.com.br Contact a doctor if:  You are dizzy.  You have a fever.  You have mild cramps or pressure in your lower belly.  You have a nagging pain in your belly area.  You continue to feel like you may vomit, you vomit, or you have watery poop (diarrhea) for 24 hours or longer.  You have a bad-smelling fluid coming from your vagina.  You have pain when you pee.  You  are exposed to a disease that spreads from person to person, such as chickenpox, measles, Zika virus, HIV, or hepatitis. Get help right away if:  You have spotting or bleeding from your vagina.  You have very bad belly cramping or pain.  You have shortness of breath or chest pain.  You have any kind of injury, such as from a fall or a car crash.  You have new or increased pain, swelling, or redness in an arm or leg. Summary  The first trimester of pregnancy starts on the first day of your last menstrual period until the end of week 12 (months 1 through 3).  Eat 4 or 5 small meals a day instead of 3 large meals.  Do not smoke or use any products that contain nicotine or tobacco. If you need help quitting, ask your doctor.  Keep all follow-up visits. This information is not intended to replace advice given to you by your health care provider. Make sure you discuss any questions you have with your health care provider. Document Revised: 08/04/2019 Document Reviewed: 06/10/2019 Elsevier Patient Education  2021 [employer].

## 2020-04-26 ENCOUNTER — Other Ambulatory Visit: Payer: Self-pay

## 2020-04-26 ENCOUNTER — Ambulatory Visit (INDEPENDENT_AMBULATORY_CARE_PROVIDER_SITE_OTHER): Payer: Medicaid Other

## 2020-04-26 DIAGNOSIS — Z3201 Encounter for pregnancy test, result positive: Secondary | ICD-10-CM | POA: Diagnosis not present

## 2020-05-05 ENCOUNTER — Other Ambulatory Visit: Payer: Self-pay

## 2020-05-05 ENCOUNTER — Ambulatory Visit (INDEPENDENT_AMBULATORY_CARE_PROVIDER_SITE_OTHER): Payer: Medicaid Other | Admitting: Surgical

## 2020-05-05 VITALS — BP 133/82 | HR 85 | Ht 66.0 in | Wt 213.9 lb

## 2020-05-05 DIAGNOSIS — Z3491 Encounter for supervision of normal pregnancy, unspecified, first trimester: Secondary | ICD-10-CM | POA: Diagnosis not present

## 2020-05-05 NOTE — Progress Notes (Signed)
Crystal Shepard presents for NOB nurse interview visit. Pregnancy confirmation done 04/17/2020. G2. P1001. Pregnancy education material explained and given. 2 cats in home. NOB labs ordered. TSH/HbgA1c ordered due to BMI 30 or greater.  HIV labs and drug screen were explained and ordered. PNV encouraged. Genetic screening options discussed. Genetic testing: Unsure. Patient may discuss with the provider. Patient to follow up with provider on 05/29/2020 weeks for NOB physical. All questions answered.

## 2020-05-06 LAB — URINALYSIS, ROUTINE W REFLEX MICROSCOPIC
Bilirubin, UA: NEGATIVE
Glucose, UA: NEGATIVE
Leukocytes,UA: NEGATIVE
Nitrite, UA: NEGATIVE
RBC, UA: NEGATIVE
Specific Gravity, UA: >=1.03 — AB (ref 1.005–1.030)
Urobilinogen, Ur: 1 mg/dL (ref 0.2–1.0)
pH, UA: 5.5 (ref 5.0–7.5)

## 2020-05-08 LAB — MONITOR DRUG PROFILE 14(MW)
Amphetamine Scrn, Ur: NEGATIVE ng/mL
BARBITURATE SCREEN URINE: NEGATIVE ng/mL
BENZODIAZEPINE SCREEN, URINE: NEGATIVE ng/mL
Buprenorphine, Urine: NEGATIVE ng/mL
CANNABINOIDS UR QL SCN: NEGATIVE ng/mL
Cocaine (Metab) Scrn, Ur: NEGATIVE ng/mL
Creatinine(Crt), U: 282.9 mg/dL (ref 20.0–300.0)
Fentanyl, Urine: NEGATIVE pg/mL
Meperidine Screen, Urine: NEGATIVE ng/mL
Methadone Screen, Urine: NEGATIVE ng/mL
OXYCODONE+OXYMORPHONE UR QL SCN: NEGATIVE ng/mL
Opiate Scrn, Ur: NEGATIVE ng/mL
Ph of Urine: 5.4 (ref 4.5–8.9)
Phencyclidine Qn, Ur: NEGATIVE ng/mL
Propoxyphene Scrn, Ur: NEGATIVE ng/mL
SPECIFIC GRAVITY: 1.027
Tramadol Screen, Urine: NEGATIVE ng/mL

## 2020-05-08 LAB — GC/CHLAMYDIA PROBE AMP
Chlamydia trachomatis, NAA: NEGATIVE
Neisseria Gonorrhoeae by PCR: NEGATIVE

## 2020-05-09 LAB — VIRAL HEPATITIS HBV, HCV
HCV Ab: <0.1 s/co ratio (ref 0.0–0.9)
Hep B Core Total Ab: NEGATIVE
Hep B Surface Ab, Qual: NONREACTIVE
Hepatitis B Surface Ag: NEGATIVE

## 2020-05-09 LAB — PARVOVIRUS B19 ANTIBODY, IGG AND IGM
Parvovirus B19 IgG: 3 index — ABNORMAL HIGH (ref 0.0–0.8)
Parvovirus B19 IgM: 0.1 index (ref 0.0–0.8)

## 2020-05-09 LAB — CULTURE, OB URINE

## 2020-05-09 LAB — HCV INTERPRETATION

## 2020-05-09 LAB — URINE CULTURE, OB REFLEX

## 2020-05-09 LAB — ABO AND RH: Rh Factor: POSITIVE

## 2020-05-09 LAB — RPR: RPR Ser Ql: NONREACTIVE

## 2020-05-09 LAB — HEMOGLOBIN A1C
Est. average glucose Bld gHb Est-mCnc: 105 mg/dL
Hgb A1c MFr Bld: 5.3 % (ref 4.8–5.6)

## 2020-05-09 LAB — RUBELLA SCREEN: Rubella Antibodies, IGG: 6.47 index (ref >0.99)

## 2020-05-09 LAB — ANTIBODY SCREEN: Antibody Screen: NEGATIVE

## 2020-05-09 LAB — TOXOPLASMA ANTIBODIES- IGG AND  IGM
Toxoplasma Antibody- IgM: <3 AU/mL (ref 0.0–7.9)
Toxoplasma IgG Ratio: <3 IU/mL (ref 0.0–7.1)

## 2020-05-09 LAB — VARICELLA ZOSTER ANTIBODY, IGG: Varicella zoster IgG: 328 index (ref >165)

## 2020-05-09 LAB — TSH: TSH: 1.71 u[IU]/mL (ref 0.450–4.500)

## 2020-05-09 LAB — HIV ANTIBODY (ROUTINE TESTING W REFLEX): HIV Screen 4th Generation wRfx: NONREACTIVE

## 2020-05-29 ENCOUNTER — Encounter: Payer: Self-pay | Admitting: Certified Nurse Midwife

## 2020-05-29 ENCOUNTER — Other Ambulatory Visit: Payer: Self-pay

## 2020-05-29 ENCOUNTER — Ambulatory Visit (INDEPENDENT_AMBULATORY_CARE_PROVIDER_SITE_OTHER): Payer: Medicaid Other | Admitting: Certified Nurse Midwife

## 2020-05-29 ENCOUNTER — Other Ambulatory Visit (HOSPITAL_COMMUNITY)
Admission: RE | Admit: 2020-05-29 | Discharge: 2020-05-29 | Disposition: A | Discharge status: Home / Self Care | Payer: Medicaid Other | Source: Ambulatory Visit | Attending: Certified Nurse Midwife | Admitting: Certified Nurse Midwife

## 2020-05-29 VITALS — BP 127/82 | HR 89 | Wt 212.0 lb

## 2020-05-29 DIAGNOSIS — Z124 Encounter for screening for malignant neoplasm of cervix: Secondary | ICD-10-CM | POA: Insufficient documentation

## 2020-05-29 DIAGNOSIS — Z130 Encounter for screening for diseases of the blood and blood-forming organs and certain disorders involving the immune mechanism: Secondary | ICD-10-CM

## 2020-05-29 DIAGNOSIS — Z3A13 13 weeks gestation of pregnancy: Secondary | ICD-10-CM

## 2020-05-29 DIAGNOSIS — Z3492 Encounter for supervision of normal pregnancy, unspecified, second trimester: Secondary | ICD-10-CM | POA: Diagnosis not present

## 2020-05-29 DIAGNOSIS — Z8632 Personal history of gestational diabetes: Secondary | ICD-10-CM

## 2020-05-29 DIAGNOSIS — Z3491 Encounter for supervision of normal pregnancy, unspecified, first trimester: Secondary | ICD-10-CM

## 2020-05-29 LAB — POCT URINALYSIS DIPSTICK OB
Bilirubin, UA: NEGATIVE
Blood, UA: NEGATIVE
Glucose, UA: NEGATIVE
Ketones, UA: NEGATIVE
Leukocytes, UA: NEGATIVE
Nitrite, UA: NEGATIVE
Spec Grav, UA: 1.01 (ref 1.010–1.025)
Urobilinogen, UA: 0.2 E.U./dL
pH, UA: 6 (ref 5.0–8.0)

## 2020-05-29 NOTE — Patient Instructions (Signed)
Healthy Weight Gain During Pregnancy A certain amount of weight gain during pregnancy is normal and healthy. The amount of weight you should gain during pregnancy depends on your overall health and your weight before you became pregnant. Talk with your health care provider to find out how much weight you should gain during your pregnancy. General guidelines for a healthy total weight gain during pregnancy are based on your body mass index (BMI) and are listed below. If your BMI at or before the start of your pregnancy is:  Less than 18.5 (underweight), you should gain 28-40 lb (13-18 kg).  18.5-24.9 (normal weight), you should gain 25-35 lb (11-16 kg).  25-29.9 (overweight), you should gain 15-25 lb (7-11 kg).  30 or higher (obese), you should gain 11-20 lb (5-9 kg). Your health care provider may recommend that you:  Gain more weight, if you were underweight before pregnancy or if you are pregnant with more than one baby.  Gain less weight, if you were overweight before pregnancy or if you are gaining too much weight during your pregnancy. How does unhealthy weight gain affect me? Gaining too much weight during pregnancy can lead to pregnancy complications, such as:  A temporary form of diabetes that develops during pregnancy (gestational diabetes).  Hypertensive disorders of pregnancy (preeclampsia or gestational hypertension).  Raising your risk of having a more difficult delivery or a surgical delivery (cesarean delivery, or C-section). How does unhealthy weight gain affect my baby? Not gaining enough weight can be life-threatening for your baby. It may also raise these risks for your baby:  Being born early (preterm).  Being smaller than normal during pregnancy or not growing normally (intrauterine growth restriction).  Having a low weight at birth. Gaining too much weight may raise these risks for your baby:  Growing larger than normal during pregnancy (large for gestational  age or macrosomia).  Increased risk of obesity. What actions can I take to gain a healthy amount of weight during pregnancy? Nutrition  Eat healthy foods. Every day, try to eat: ? Fruits and vegetables. Include a variety of colors and types, like sweet potatoes, oranges, apples, bell peppers, beets, berries, squash, and broccoli. ? Whole grains, such as millet, barley, whole-wheat breads and cereals, and oatmeal. ? Low-fat dairy products, like yogurt, milk, and cheese. You can also include non-dairy choices, like almond milk or rice milk. ? Protein-rich foods, like lean meat, chicken, eggs, peas, and beans.  Avoid foods that are fried or have a lot of fat, salt (sodium), or sugar.  Drink enough fluid to keep your urine pale yellow.  Choose healthy snack and drink options when you are away from home: ? Drink water. Avoid soda, sports drinks, and juices that have added sugar. ? Avoid drinks with caffeine, such as coffee and energy drinks. ? Eat snacks that are high in protein, such as nuts, protein bars, and low-fat yogurt. ? Carry convenient snacks with you that do not need refrigeration, such as a pack of trail mix, an apple, or a granola bar.  If you need help improving your diet, work with a health care provider or a dietitian.   Activity  Exercise regularly, as told by your health care provider. ? If you were active before becoming pregnant, you may be able to continue your regular fitness activities. ? If you were not active before pregnancy, you may gradually build up to exercising for 30 or more minutes on most days of the week. This may include walking, swimming,   or yoga.  Ask your health care provider what activities are safe for you. Talk with your health care provider about whether you may need to be excused from certain school or work activities.   Follow these instructions at home:  Take over-the-counter and prescription medicines only as told by your health care  provider.  Take all prenatal supplements as told by your health care provider.  Keep track of your weight gain during pregnancy.  Keep all health care visits during pregnancy (prenatal visits). These visits are a good time to discuss your weight gain. Your health care provider will weigh you at each visit to make sure you are gaining a healthy amount of weight. Where to find support Some pregnant women and teens face unique challenges and need extra support. If you have questions or need help gaining a healthy amount of weight during pregnancy, these people may help:  Your health care provider.  A dietitian.  Your school nurse.  Your family and friends.  Local counseling centers, church groups, or clinics that have services for teens. Where to find more information Learn more about managing your weight gain during pregnancy from:  American Pregnancy Association: americanpregnancy.org  [place] Department of Agriculture pregnancy weight gain calculator: myplate.gov Contact a health care provider if:  You are unable to eat or drink for longer than 24 hours.  You cannot afford food or have trouble accessing regular meals. Get help right away if you: Summary  Talk with your health care provider to find out how much weight you should gain during your pregnancy.  Too much or too little weight gain during pregnancy can lead to complications for you and your baby.  Eat healthy foods like fruits and vegetables, whole grains, low-fat dairy products, and protein-rich foods.  Ask your health care provider what activities are safe for you.  Keep all of your prenatal visits. This information is not intended to replace advice given to you by your health care provider. Make sure you discuss any questions you have with your health care provider. Document Revised: 09/23/2019 Document Reviewed: 09/23/2019 Elsevier Patient Education  2021 Elsevier Inc.    Common Medications Safe in  Pregnancy  Acne:      Constipation:  Benzoyl Peroxide     Colace  Clindamycin      Dulcolax Suppository  Topica Erythromycin     Fibercon  Salicylic Acid      Metamucil         Miralax AVOID:        Senakot   Accutane    Cough:  Retin-A       Cough Drops  Tetracycline      Phenergan w/ Codeine if Rx  Minocycline      Robitussin (Plain & DM)  Antibiotics:     Crabs/Lice:  Ceclor       RID  Cephalosporins    AVOID:  E-Mycins      Kwell  Keflex  Macrobid/Macrodantin   Diarrhea:  Penicillin      Kao-Pectate  Zithromax      Imodium AD         PUSH FLUIDS AVOID:       Cipro     Fever:  Tetracycline      Tylenol (Regular or Extra  Minocycline       Strength)  Levaquin      Extra Strength-Do not          Exceed 8 tabs/24 hrs Caffeine:        <  200mg/day (equiv. To 1 cup of coffee or  approx. 3 12 oz sodas)         Gas: Cold/Hayfever:       Gas-X  Benadryl      Mylicon  Claritin       Phazyme  **Claritin-D        Chlor-Trimeton    Headaches:  Dimetapp      ASA-Free Excedrin  Drixoral-Non-Drowsy     Cold Compress  Mucinex (Guaifenasin)     Tylenol (Regular or Extra  Sudafed/Sudafed-12 Hour     Strength)  **Sudafed PE Pseudoephedrine   Tylenol Cold & Sinus     Vicks Vapor Rub  Zyrtec  **AVOID if Problems With Blood Pressure         Heartburn: Avoid lying down for at least 1 hour after meals  Aciphex      Maalox     Rash:  Milk of Magnesia     Benadryl    Mylanta       1% Hydrocortisone Cream  Pepcid  Pepcid Complete   Sleep Aids:  Prevacid      Ambien   Prilosec       Benadryl  Rolaids       Chamomile Tea  Tums (Limit 4/day)     Unisom         Tylenol PM         Warm milk-add vanilla or  Hemorrhoids:       Sugar for taste  Anusol/Anusol H.C.  (RX: Analapram 2.5%)  Sugar Substitutes:  Hydrocortisone OTC     Ok in moderation  Preparation H      Tucks        Vaseline lotion applied to tissue with  wiping    Herpes:     Throat:  Acyclovir      Oragel  Famvir  Valtrex     Vaccines:         Flu Shot Leg Cramps:       *Gardasil  Benadryl      Hepatitis A         Hepatitis B Nasal Spray:       Pneumovax  Saline Nasal Spray     Polio Booster         Tetanus Nausea:       Tuberculosis test or PPD  Vitamin B6 25 mg TID   AVOID:    Dramamine      *Gardasil  Emetrol       Live Poliovirus  Ginger Root 250 mg QID    MMR (measles, mumps &  High Complex Carbs @ Bedtime    rebella)  Sea Bands-Accupressure    Varicella (Chickenpox)  Unisom 1/2 tab TID     *No known complications           If received before Pain:         Known pregnancy;   Darvocet       Resume series after  Lortab        Delivery  Percocet    Yeast:   Tramadol      Femstat  Tylenol 3      Gyne-lotrimin  Ultram       Monistat  Vicodin           MISC:         All Sunscreens           Hair Coloring/highlights          Insect   Repellant's          (Including DEET)         Mystic Tans   Second Trimester of Pregnancy  The second trimester of pregnancy is from week 13 through week 27. This is also called months 4 through 6 of pregnancy. This is often the time when you feel your best. During the second trimester:  Morning sickness is less or has stopped.  You may have more energy.  You may feel hungry more often. At this time, your unborn baby (fetus) is growing very fast. At the end of the sixth month, the unborn baby may be up to 12 inches long and weigh about 1 pounds. You will likely start to feel the baby move between 16 and 20 weeks of pregnancy. Body changes during your second trimester Your body continues to go through many changes during this time. The changes vary and generally return to normal after the baby is born. Physical changes  You will gain more weight.  You may start to get stretch marks on your hips, belly (abdomen), and breasts.  Your breasts will grow and may hurt.  Dark spots or  blotches may develop on your face.  A dark line from your belly button to the pubic area (linea nigra) may appear.  You may have changes in your hair. Health changes  You may have headaches.  You may have heartburn.  You may have trouble pooping (constipation).  You may have hemorrhoids or swollen, bulging veins (varicose veins).  Your gums may bleed.  You may pee (urinate) more often.  You may have back pain. Follow these instructions at home: Medicines  Take over-the-counter and prescription medicines only as told by your doctor. Some medicines are not safe during pregnancy.  Take a prenatal vitamin that contains at least 600 micrograms (mcg) of folic acid. Eating and drinking  Eat healthy meals that include: ? Fresh fruits and vegetables. ? Whole grains. ? Good sources of protein, such as meat, eggs, or tofu. ? Low-fat dairy products.  Avoid raw meat and unpasteurized juice, milk, and cheese.  You may need to take these actions to prevent or treat trouble pooping: ? Drink enough fluids to keep your pee (urine) pale yellow. ? Eat foods that are high in fiber. These include beans, whole grains, and fresh fruits and vegetables. ? Limit foods that are high in fat and sugar. These include fried or sweet foods. Activity  Exercise only as told by your doctor. Most people can do their usual exercise during pregnancy. Try to exercise for 30 minutes at least 5 days a week.  Stop exercising if you have pain or cramps in your belly or lower back.  Do not exercise if it is too hot or too humid, or if you are in a place of great height (high altitude).  Avoid heavy lifting.  If you choose to, you may have sex unless your doctor tells you not to. Relieving pain and discomfort  Wear a good support bra if your breasts are sore.  Take warm water baths (sitz baths) to soothe pain or discomfort caused by hemorrhoids. Use hemorrhoid cream if your doctor approves.  Rest with  your legs raised (elevated) if you have leg cramps or low back pain.  If you develop bulging veins in your legs: ? Wear support hose as told by your doctor. ? Raise your feet for 15 minutes, 3-4 times a day. ? Limit salt in your food. Safety    Wear your seat belt at all times when you are in a car.  Talk with your doctor if someone is hurting you or yelling at you a lot. Lifestyle  Do not use hot tubs, steam rooms, or saunas.  Do not douche. Do not use tampons or scented sanitary pads.  Avoid cat litter boxes and soil used by cats. These carry germs that can harm your baby and can cause a loss of your baby by miscarriage or stillbirth.  Do not use herbal medicines, illegal drugs, or medicines that are not approved by your doctor. Do not drink alcohol.  Do not smoke or use any products that contain nicotine or tobacco. If you need help quitting, ask your doctor. General instructions  Keep all follow-up visits. This is important.  Ask your doctor about local prenatal classes.  Ask your doctor about the right foods to eat or for help finding a counselor. Where to find more information  American Pregnancy Association: americanpregnancy.org  American College of Obstetricians and Gynecologists: www.acog.org  Office on Women's Health: womenshealth.gov/pregnancy Contact a doctor if:  You have a headache that does not go away when you take medicine.  You have changes in how you see, or you see spots in front of your eyes.  You have mild cramps, pressure, or pain in your lower belly.  You continue to feel like you may vomit (nauseous), you vomit, or you have watery poop (diarrhea).  You have bad-smelling fluid coming from your vagina.  You have pain when you pee or your pee smells bad.  You have very bad swelling of your face, hands, ankles, feet, or legs.  You have a fever. Get help right away if:  You are leaking fluid from your vagina.  You have spotting or bleeding  from your vagina.  You have very bad belly cramping or pain.  You have trouble breathing.  You have chest pain.  You faint.  You have not felt your baby move for the time period told by your doctor.  You have new or increased pain, swelling, or redness in an arm or leg. Summary  The second trimester of pregnancy is from week 13 through week 27 (months 4 through 6).  Eat healthy meals.  Exercise as told by your doctor. Most people can do their usual exercise during pregnancy.  Do not use herbal medicines, illegal drugs, or medicines that are not approved by your doctor. Do not drink alcohol.  Call your doctor if you get sick or if you notice anything unusual about your pregnancy. This information is not intended to replace advice given to you by your health care provider. Make sure you discuss any questions you have with your health care provider. Document Revised: 08/04/2019 Document Reviewed: 06/10/2019 Elsevier Patient Education  2021 Elsevier Inc.  

## 2020-05-29 NOTE — Progress Notes (Signed)
Patient comes in today for new OB physical. Patient has no concerns today.

## 2020-05-29 NOTE — Progress Notes (Signed)
NEW OB HISTORY AND PHYSICAL  SUBJECTIVE:       Crystal Shepard is a 25 y.o. [id number] female, Patient's last menstrual period was 02/28/2020 (approximate)., Estimated Date of Delivery: 12/04/20, [redacted]w[redacted]d, presents today for establishment of Prenatal Care. Notes resolving nausea and breast tenderness.   Denies difficulty breathing or respiratory distress, chest pain, abdominal pain, vaginal bleeding, dysuria, and leg pain.   Declines genetic screening. History significant for gestational diabetes.   Patient transferring care after today's visit, relocated to [place].    Gynecologic History  Patient's last menstrual period was 02/28/2020 (approximate).   Contraception: none  Last Pap: 2014. Results were: normal  Obstetric History  OB History  Gravida Para Term Preterm AB Living  2 1 1     1   SAB IAB Ectopic Multiple Live Births        0 1    # Outcome Date GA Lbr Len/2nd Weight Sex Delivery Anes PTL Lv  2 Current           1 Term 05/29/18 [redacted]w[redacted]d / 00:25 8 lb 5.7 oz (3.79 kg) M Vag-Spont None  LIV    Past Medical History:  Diagnosis Date  . Anemia     Past Surgical History:  Procedure Laterality Date  . NO PAST SURGERIES      Current Outpatient Medications on File Prior to Visit  Medication Sig Dispense Refill  . Prenatal Vit-Fe Fumarate-FA (PRENATAL MULTIVITAMIN) TABS tablet Take 1 tablet by mouth daily at 12 noon.     No current facility-administered medications on file prior to visit.    No Known Allergies  Social History   Socioeconomic History  . Marital status: Single    Spouse name: Not on file  . Number of children: Not on file  . Years of education: Not on file  . Highest education level: Not on file  Occupational History  . Not on file  Tobacco Use  . Smoking status: Never Smoker  . Smokeless tobacco: Never Used  Vaping Use  . Vaping Use: Never used  Substance and Sexual Activity  . Alcohol use: Not Currently  . Drug use: Never  . Sexual  activity: Yes    Birth control/protection: Pill  Other Topics Concern  . Not on file  Social History Narrative  . Not on file   Social Determinants of Health   Financial Resource Strain: Not on file  Food Insecurity: Not on file  Transportation Needs: Not on file  Physical Activity: Not on file  Stress: Not on file  Social Connections: Not on file  Intimate Partner Violence: Not on file    History reviewed. No pertinent family history.  The following portions of the patient's history were reviewed and updated as appropriate: allergies, current medications, past OB history, past medical history, past surgical history, past family history, past social history, and problem list.  Review of Systems:  ROS negative except as noted above. Information obtained from patient.   OBJECTIVE:  BP 127/82   Pulse 89   Wt 212 lb (96.2 kg)   LMP 02/28/2020 (Approximate)   BMI 34.22 kg/m   Initial Physical Exam (New OB)  GENERAL APPEARANCE: alert, well appearing, in no apparent distress  HEAD: normocephalic, atraumatic  MOUTH: deferred due to COVID-19 pandemic  THYROID: no thyromegaly or masses present  BREASTS: no masses noted, no significant tenderness, no palpable axillary nodes, no skin changes  LUNGS: clear to auscultation, no wheezes, rales or rhonchi, symmetric air entry  HEART: regular  rate and rhythm, no murmurs  ABDOMEN: soft, nontender, nondistended, no abnormal masses, no epigastric pain, obese and FHT present  EXTREMITIES: no redness or tenderness in the calves or thighs, no edema  SKIN: normal coloration and turgor, no rashes  LYMPH NODES: no adenopathy palpable  NEUROLOGIC: alert, oriented, normal speech, no focal findings or movement disorder noted  PELVIC EXAM EXTERNAL GENITALIA: normal appearing vulva with no masses, tenderness or lesions VAGINA: no abnormal discharge or lesions CERVIX: no lesions or cervical motion tenderness and Pap  collected  ASSESSMENT:  Normal pregnancy  Screening cervical cancer  History gestational diabetes  Screening anemia  PLAN: Prenatal care New OB counseling: The patient has been given an overview regarding routine prenatal care. Recommendations regarding diet, weight gain, and exercise in pregnancy were given. Prenatal testing, optional genetic testing, and ultrasound use in pregnancy were reviewed.  Benefits of Breast Feeding were discussed. The patient is encouraged to consider nursing her baby post partum. See orders

## 2020-05-30 LAB — CBC
Hematocrit: 34.5 % (ref 34.0–46.6)
Hemoglobin: 11.8 g/dL (ref 11.1–15.9)
MCH: 29.4 pg (ref 26.6–33.0)
MCHC: 34.2 g/dL (ref 31.5–35.7)
MCV: 86 fL (ref 79–97)
Platelets: 225 10*3/uL (ref 150–450)
RBC: 4.01 x10E6/uL (ref 3.77–5.28)
RDW: 13.1 % (ref 11.7–15.4)
WBC: 7.5 10*3/uL (ref 3.4–10.8)

## 2020-05-31 LAB — CYTOLOGY - PAP: Diagnosis: NEGATIVE

## 2020-06-26 ENCOUNTER — Encounter: Payer: Medicaid Other | Admitting: Certified Nurse Midwife

## 2020-06-26 DIAGNOSIS — Z3492 Encounter for supervision of normal pregnancy, unspecified, second trimester: Secondary | ICD-10-CM

## 2020-06-26 DIAGNOSIS — Z3A17 17 weeks gestation of pregnancy: Secondary | ICD-10-CM

## 2020-06-26 NOTE — Patient Instructions (Incomplete)
Second Trimester of Pregnancy  The second trimester of pregnancy is from week 13 through week 27. This is also called months 4 through 6 of pregnancy. This is often the time when you feel your best. During the second trimester:  Morning sickness is less or has stopped.  You may have more energy.  You may feel hungry more often. At this time, your unborn baby (fetus) is growing very fast. At the end of the sixth month, the unborn baby may be up to 12 inches long and weigh about 1 pounds. You will likely start to feel the baby move between 16 and 20 weeks of pregnancy. Body changes during your second trimester Your body continues to go through many changes during this time. The changes vary and generally return to normal after the baby is born. Physical changes  You will gain more weight.  You may start to get stretch marks on your hips, belly (abdomen), and breasts.  Your breasts will grow and may hurt.  Dark spots or blotches may develop on your face.  A dark line from your belly button to the pubic area (linea nigra) may appear.  You may have changes in your hair. Health changes  You may have headaches.  You may have heartburn.  You may have trouble pooping (constipation).  You may have hemorrhoids or swollen, bulging veins (varicose veins).  Your gums may bleed.  You may pee (urinate) more often.  You may have back pain. Follow these instructions at home: Medicines  Take over-the-counter and prescription medicines only as told by your doctor. Some medicines are not safe during pregnancy.  Take a prenatal vitamin that contains at least 600 micrograms (mcg) of folic acid. Eating and drinking  Eat healthy meals that include: ? Fresh fruits and vegetables. ? Whole grains. ? Good sources of protein, such as meat, eggs, or tofu. ? Low-fat dairy products.  Avoid raw meat and unpasteurized juice, milk, and cheese.  You may need to take these actions to prevent or  treat trouble pooping: ? Drink enough fluids to keep your pee (urine) pale yellow. ? Eat foods that are high in fiber. These include beans, whole grains, and fresh fruits and vegetables. ? Limit foods that are high in fat and sugar. These include fried or sweet foods. Activity  Exercise only as told by your doctor. Most people can do their usual exercise during pregnancy. Try to exercise for 30 minutes at least 5 days a week.  Stop exercising if you have pain or cramps in your belly or lower back.  Do not exercise if it is too hot or too humid, or if you are in a place of great height (high altitude).  Avoid heavy lifting.  If you choose to, you may have sex unless your doctor tells you not to. Relieving pain and discomfort  Wear a good support bra if your breasts are sore.  Take warm water baths (sitz baths) to soothe pain or discomfort caused by hemorrhoids. Use hemorrhoid cream if your doctor approves.  Rest with your legs raised (elevated) if you have leg cramps or low back pain.  If you develop bulging veins in your legs: ? Wear support hose as told by your doctor. ? Raise your feet for 15 minutes, 3-4 times a day. ? Limit salt in your food. Safety  Wear your seat belt at all times when you are in a car.  Talk with your doctor if someone is hurting you or yelling  at you a lot. Lifestyle  Do not use hot tubs, steam rooms, or saunas.  Do not douche. Do not use tampons or scented sanitary pads.  Avoid cat litter boxes and soil used by cats. These carry germs that can harm your baby and can cause a loss of your baby by miscarriage or stillbirth.  Do not use herbal medicines, illegal drugs, or medicines that are not approved by your doctor. Do not drink alcohol.  Do not smoke or use any products that contain nicotine or tobacco. If you need help quitting, ask your doctor. General instructions  Keep all follow-up visits. This is important.  Ask your doctor about local  prenatal classes.  Ask your doctor about the right foods to eat or for help finding a counselor. Where to find more information  American Pregnancy Association: americanpregnancy.org  American College of Obstetricians and Gynecologists: www.acog.org  Office on Women's Health: womenshealth.gov/pregnancy Contact a doctor if:  You have a headache that does not go away when you take medicine.  You have changes in how you see, or you see spots in front of your eyes.  You have mild cramps, pressure, or pain in your lower belly.  You continue to feel like you may vomit (nauseous), you vomit, or you have watery poop (diarrhea).  You have bad-smelling fluid coming from your vagina.  You have pain when you pee or your pee smells bad.  You have very bad swelling of your face, hands, ankles, feet, or legs.  You have a fever. Get help right away if:  You are leaking fluid from your vagina.  You have spotting or bleeding from your vagina.  You have very bad belly cramping or pain.  You have trouble breathing.  You have chest pain.  You faint.  You have not felt your baby move for the time period told by your doctor.  You have new or increased pain, swelling, or redness in an arm or leg. Summary  The second trimester of pregnancy is from week 13 through week 27 (months 4 through 6).  Eat healthy meals.  Exercise as told by your doctor. Most people can do their usual exercise during pregnancy.  Do not use herbal medicines, illegal drugs, or medicines that are not approved by your doctor. Do not drink alcohol.  Call your doctor if you get sick or if you notice anything unusual about your pregnancy. This information is not intended to replace advice given to you by your health care provider. Make sure you discuss any questions you have with your health care provider. Document Revised: 08/04/2019 Document Reviewed: 06/10/2019 Elsevier Patient Education  2021 [employer]  [employer]. Common Medications Safe in Pregnancy  Acne:      Constipation:  Benzoyl Peroxide     Colace  Clindamycin      Dulcolax Suppository  Topica Erythromycin     Fibercon  Salicylic Acid      Metamucil         Miralax AVOID:        Senakot   Accutane    Cough:  Retin-A       Cough Drops  Tetracycline      Phenergan w/ Codeine if Rx  Minocycline      Robitussin (Plain & DM)  Antibiotics:     Crabs/Lice:  Ceclor       RID  Cephalosporins    AVOID:  E-Mycins      Kwell  Keflex  Macrobid/Macrodantin   Diarrhea:    Penicillin      Kao-Pectate  Zithromax      Imodium AD         PUSH FLUIDS AVOID:       Cipro     Fever:  Tetracycline      Tylenol (Regular or Extra  Minocycline       Strength)  Levaquin      Extra Strength-Do not          Exceed 8 tabs/24 hrs Caffeine:        <200mg/day (equiv. To 1 cup of coffee or  approx. 3 12 oz sodas)         Gas: Cold/Hayfever:       Gas-X  Benadryl      Mylicon  Claritin       Phazyme  **Claritin-D        Chlor-Trimeton    Headaches:  Dimetapp      ASA-Free Excedrin  Drixoral-Non-Drowsy     Cold Compress  Mucinex (Guaifenasin)     Tylenol (Regular or Extra  Sudafed/Sudafed-12 Hour     Strength)  **Sudafed PE Pseudoephedrine   Tylenol Cold & Sinus     Vicks Vapor Rub  Zyrtec  **AVOID if Problems With Blood Pressure         Heartburn: Avoid lying down for at least 1 hour after meals  Aciphex      Maalox     Rash:  Milk of Magnesia     Benadryl    Mylanta       1% Hydrocortisone Cream  Pepcid  Pepcid Complete   Sleep Aids:  Prevacid      Ambien   Prilosec       Benadryl  Rolaids       Chamomile Tea  Tums (Limit 4/day)     Unisom         Tylenol PM         Warm milk-add vanilla or  Hemorrhoids:       Sugar for taste  Anusol/Anusol H.C.  (RX: Analapram 2.5%)  Sugar Substitutes:  Hydrocortisone OTC     Ok in moderation  Preparation H      Tucks        Vaseline lotion applied to tissue with  wiping    Herpes:     Throat:  Acyclovir      Oragel  Famvir  Valtrex     Vaccines:         Flu Shot Leg Cramps:       *Gardasil  Benadryl      Hepatitis A         Hepatitis B Nasal Spray:       Pneumovax  Saline Nasal Spray     Polio Booster         Tetanus Nausea:       Tuberculosis test or PPD  Vitamin B6 25 mg TID   AVOID:    Dramamine      *Gardasil  Emetrol       Live Poliovirus  Ginger Root 250 mg QID    MMR (measles, mumps &  High Complex Carbs @ Bedtime    rebella)  Sea Bands-Accupressure    Varicella (Chickenpox)  Unisom 1/2 tab TID     *No known complications           If received before Pain:         Known pregnancy;   Darvocet       Resume   series after  Lortab        Delivery  Percocet    Yeast:   Tramadol      Femstat  Tylenol 3      Gyne-lotrimin  Ultram       Monistat  Vicodin           MISC:         All Sunscreens           Hair Coloring/highlights          Insect Repellant's          (Including DEET)         Mystic Tans  

## 2020-07-05 ENCOUNTER — Encounter: Payer: Self-pay | Admitting: Certified Nurse Midwife

## 2020-08-06 IMAGING — US US OB TRANSVAGINAL
1 series · 15 of 28 positions shown · non-contrast
Comparison: None.

CLINICAL DATA: Viability of first trimester of pregnancy

EXAM:
OBSTETRIC <14 WK US AND TRANSVAGINAL OB US
TECHNIQUE: Both transabdominal and transvaginal ultrasound examinations were
performed for complete evaluation of the gestation as well as the
maternal uterus, adnexal regions, and pelvic cul-de-sac.
Transvaginal technique was performed to assess early pregnancy.

[Series 1: us ob transvaginal · 33 acquisitions, 15 frames shown]
[im 1/33]
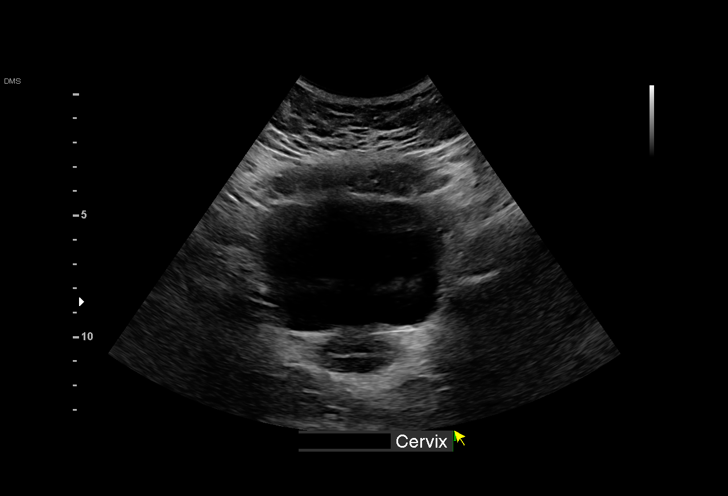
[im 3/33]
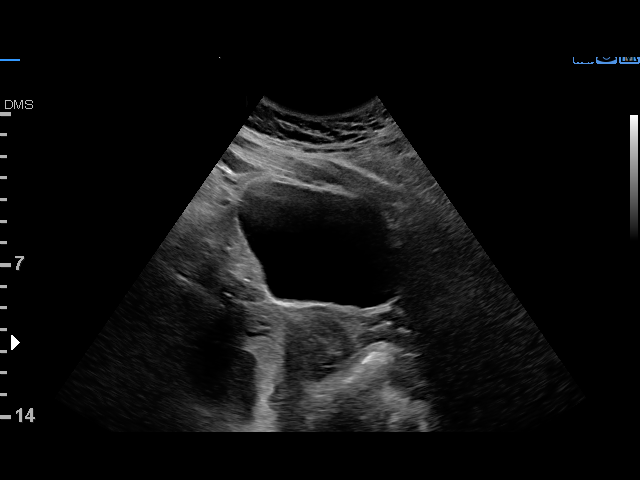
[im 5/33]
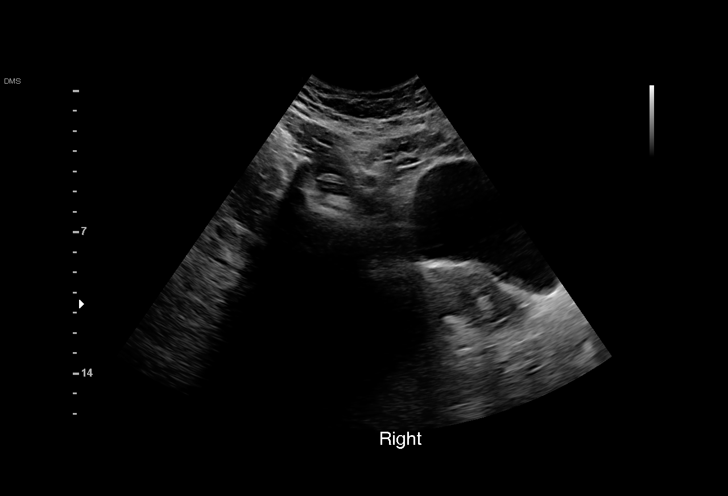
[im 8/33]
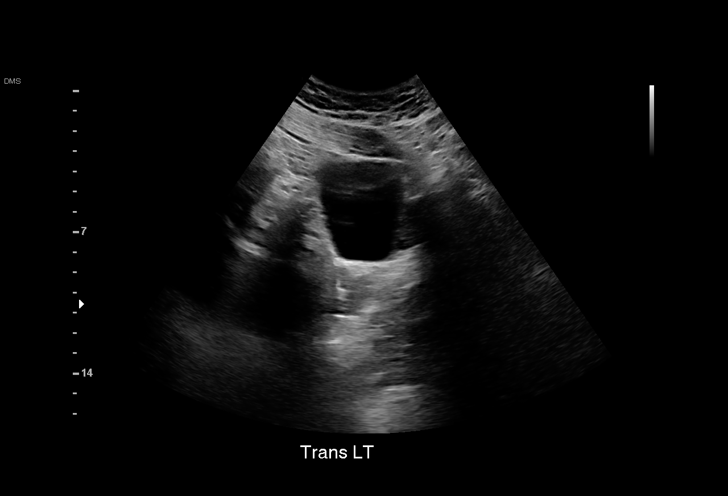
[im 10/33]
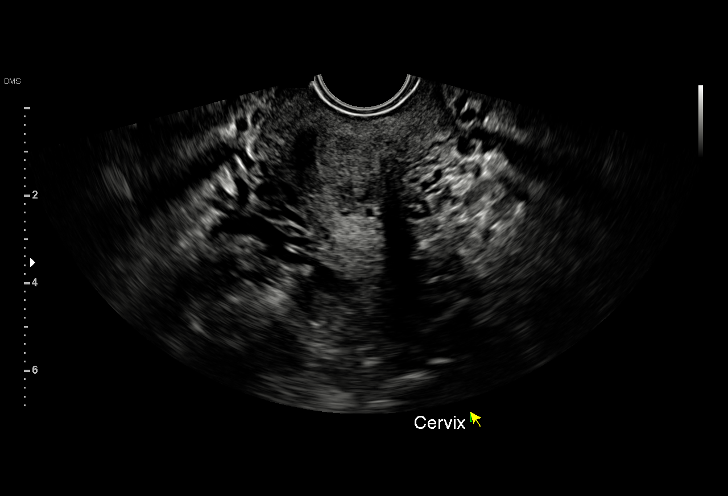
[im 12/33]
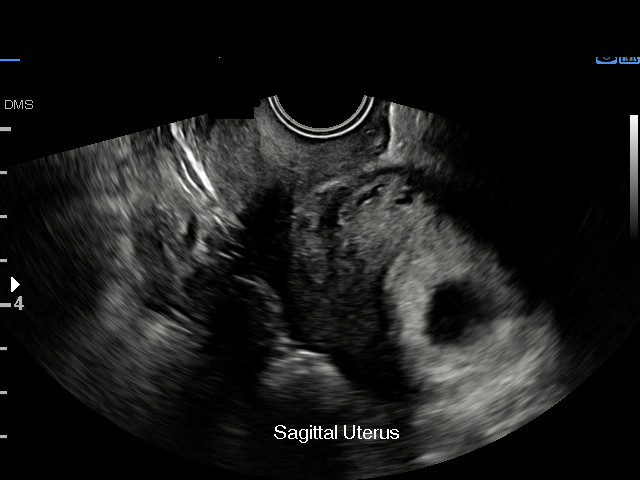
[im 15/33]
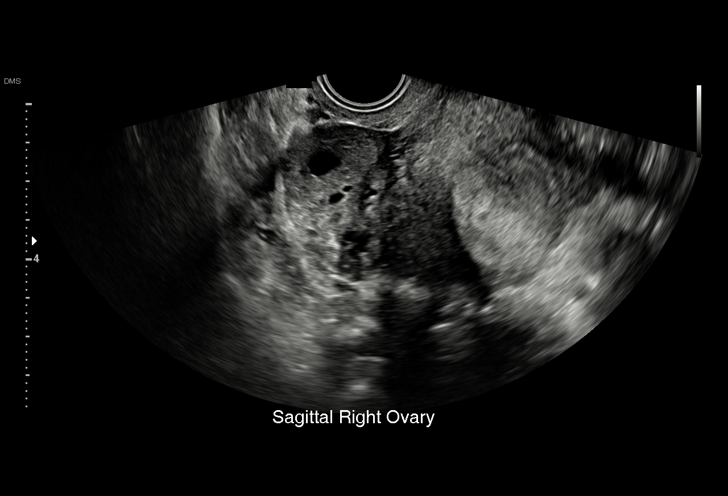
[im 17/33]
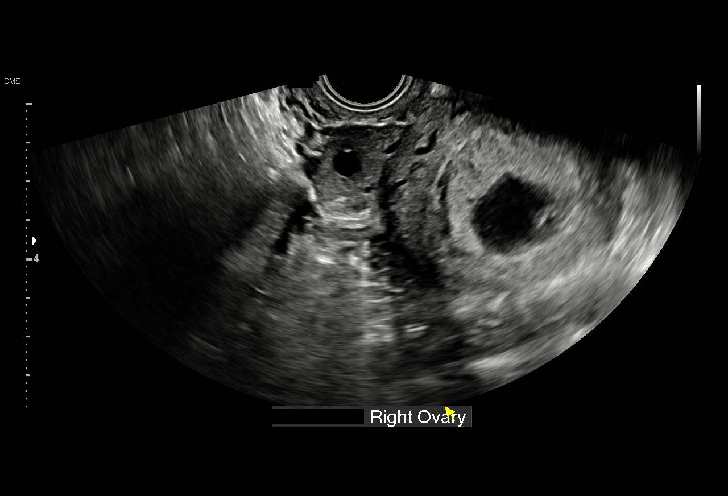
[im 18/33]
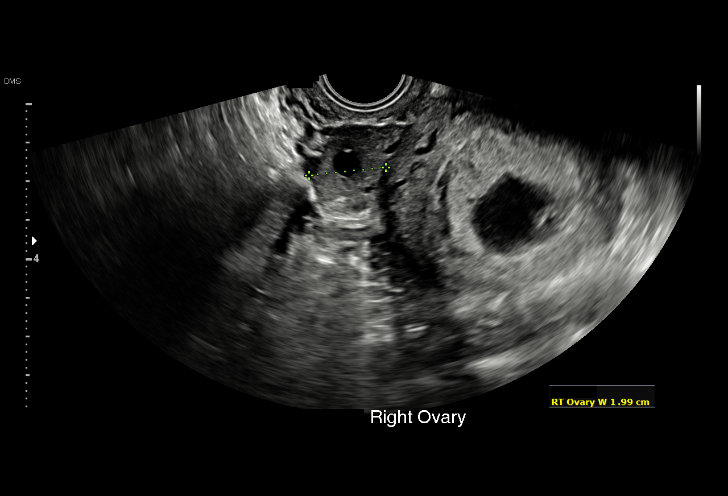
[im 21/33]
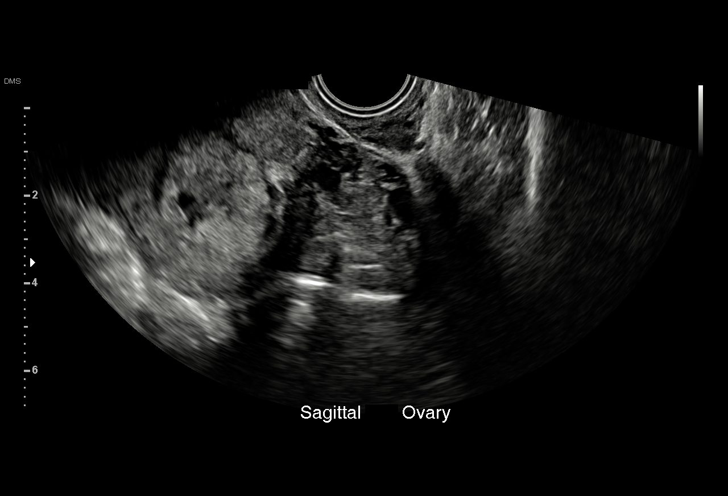
[im 23/33]
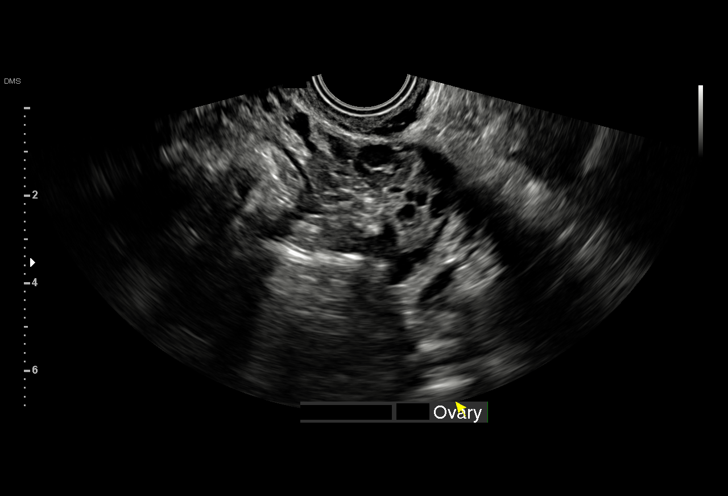
[im 25/33]
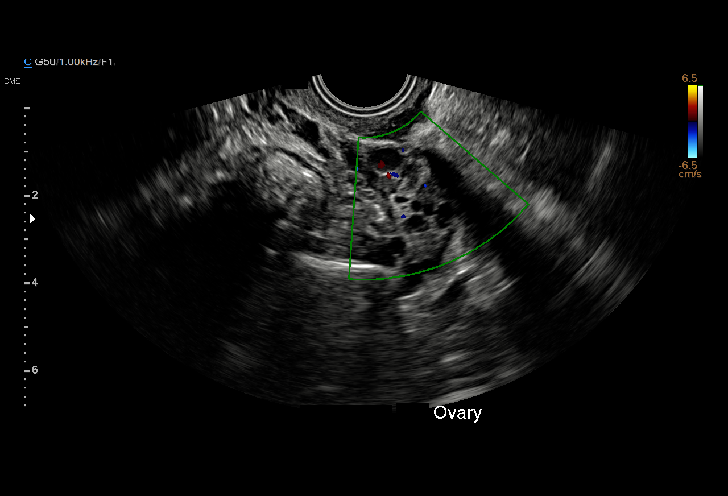
[im 28/33]
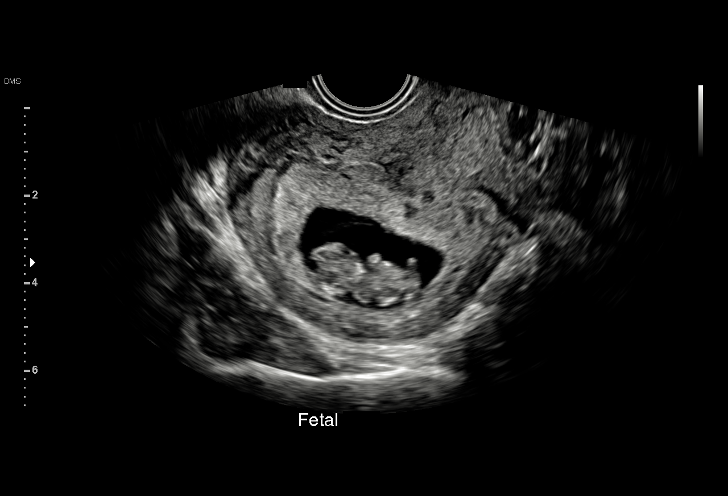
[im 30/33]
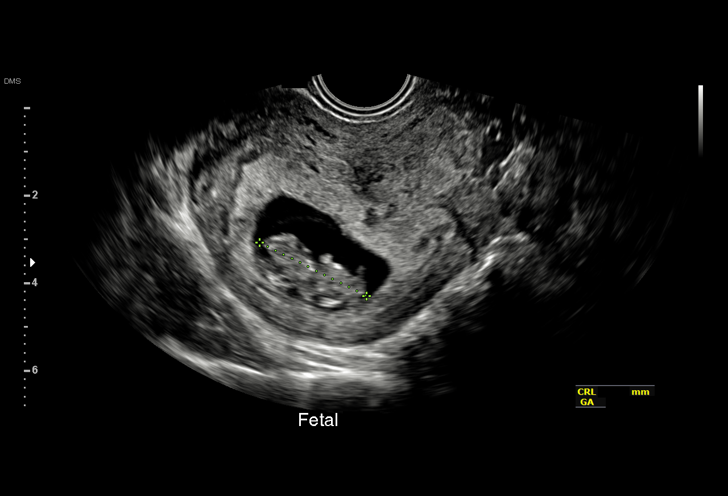
[im 33/33]
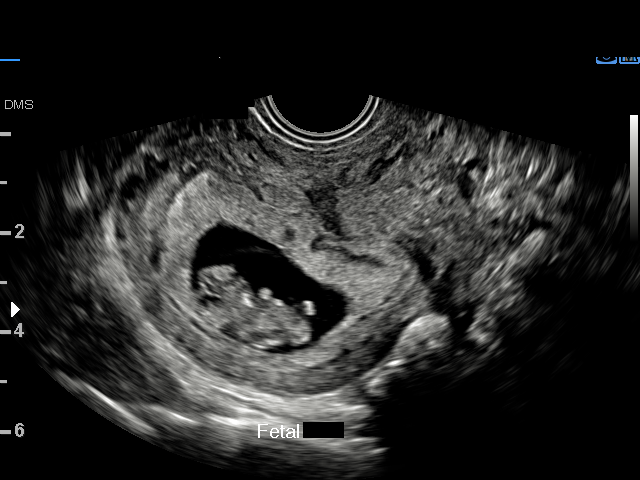

[15 of 28 positions shown; findings below may reference images not displayed]

FINDINGS: Intrauterine gestational sac: Single visualized.

Yolk sac:  Visualized.

Embryo:  Visualized.

Cardiac Activity: Visualized.

Heart Rate: 180 bpm

CRL:  27.05 mm   9 w   4 d                  US EDC: May 31, 2018.

Subchorionic hemorrhage:  None visualized.

Maternal uterus/adnexae: Ovaries are unremarkable. No free fluid is
noted.
IMPRESSION: Single live intrauterine gestation of 9 weeks 4 days.
